# Patient Record
Sex: Female | Born: 1985 | Race: Black or African American | Hispanic: No | Marital: Single | Smoking: Never smoker
Health system: Southern US, Community
[De-identification: ages and names within clinical notes are randomized; demographics above are authoritative.]

## PROBLEM LIST (undated history)

## (undated) DIAGNOSIS — Z972 Presence of dental prosthetic device (complete) (partial): Secondary | ICD-10-CM

## (undated) DIAGNOSIS — F32A Depression, unspecified: Secondary | ICD-10-CM

## (undated) DIAGNOSIS — R0609 Other forms of dyspnea: Secondary | ICD-10-CM

## (undated) DIAGNOSIS — K219 Gastro-esophageal reflux disease without esophagitis: Secondary | ICD-10-CM

## (undated) DIAGNOSIS — F329 Major depressive disorder, single episode, unspecified: Secondary | ICD-10-CM

## (undated) DIAGNOSIS — F419 Anxiety disorder, unspecified: Secondary | ICD-10-CM

## (undated) HISTORY — PX: NO PAST SURGERIES: SHX2092

## (undated) HISTORY — DX: Anxiety disorder, unspecified: F41.9

## (undated) HISTORY — DX: Major depressive disorder, single episode, unspecified: F32.9

## (undated) HISTORY — DX: Other forms of dyspnea: R06.09

## (undated) HISTORY — DX: Depression, unspecified: F32.A

---

## 2010-08-21 ENCOUNTER — Emergency Department (HOSPITAL_BASED_OUTPATIENT_CLINIC_OR_DEPARTMENT_OTHER)
Admission: EM | Admit: 2010-08-21 | Discharge: 2010-08-21 | Disposition: A | Discharge status: Home / Self Care | Payer: Self-pay | Attending: Emergency Medicine | Admitting: Emergency Medicine

## 2010-08-21 DIAGNOSIS — R51 Headache: Secondary | ICD-10-CM | POA: Insufficient documentation

## 2010-08-21 DIAGNOSIS — K047 Periapical abscess without sinus: Secondary | ICD-10-CM | POA: Insufficient documentation

## 2013-06-23 ENCOUNTER — Ambulatory Visit: Payer: BC Managed Care – PPO

## 2016-02-05 ENCOUNTER — Encounter (INDEPENDENT_AMBULATORY_CARE_PROVIDER_SITE_OTHER): Payer: Self-pay

## 2016-02-05 ENCOUNTER — Ambulatory Visit (INDEPENDENT_AMBULATORY_CARE_PROVIDER_SITE_OTHER): Payer: 59 | Admitting: Psychiatry

## 2016-02-05 ENCOUNTER — Encounter (HOSPITAL_COMMUNITY): Payer: Self-pay | Admitting: Psychiatry

## 2016-02-05 VITALS — BP 118/72 | HR 87 | Ht 60.0 in | Wt 168.2 lb

## 2016-02-05 DIAGNOSIS — F332 Major depressive disorder, recurrent severe without psychotic features: Secondary | ICD-10-CM | POA: Diagnosis not present

## 2016-02-05 DIAGNOSIS — F411 Generalized anxiety disorder: Secondary | ICD-10-CM

## 2016-02-05 DIAGNOSIS — F4310 Post-traumatic stress disorder, unspecified: Secondary | ICD-10-CM

## 2016-02-05 MED ORDER — VENLAFAXINE HCL ER 75 MG PO CP24: 225.0000 mg | ORAL_CAPSULE | Freq: Every day | ORAL | 3 refills | Status: DC
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
## 2016-04-26 ENCOUNTER — Ambulatory Visit: Payer: Self-pay

## 2016-04-27 ENCOUNTER — Ambulatory Visit (INDEPENDENT_AMBULATORY_CARE_PROVIDER_SITE_OTHER): Payer: 59 | Admitting: Physician Assistant

## 2016-04-27 ENCOUNTER — Ambulatory Visit (INDEPENDENT_AMBULATORY_CARE_PROVIDER_SITE_OTHER): Payer: 59

## 2016-04-27 VITALS — BP 120/80 | HR 88 | Temp 97.8°F | Resp 16 | Ht 64.0 in | Wt 187.0 lb

## 2016-04-27 DIAGNOSIS — K5900 Constipation, unspecified: Secondary | ICD-10-CM | POA: Diagnosis not present

## 2016-04-27 DIAGNOSIS — M542 Cervicalgia: Secondary | ICD-10-CM | POA: Diagnosis not present

## 2016-04-27 DIAGNOSIS — F4310 Post-traumatic stress disorder, unspecified: Secondary | ICD-10-CM

## 2016-04-27 MED ORDER — LINACLOTIDE 145 MCG PO CAPS: 145.0000 ug | ORAL_CAPSULE | Freq: Every day | ORAL | 2 refills | Status: DC

## 2016-04-27 MED ORDER — VENLAFAXINE HCL 75 MG PO TABS: 75.0000 mg | ORAL_TABLET | Freq: Three times a day (TID) | ORAL | 0 refills | Status: DC

## 2016-04-27 MED ORDER — MELOXICAM 7.5 MG PO TABS: 7.5000 mg | ORAL_TABLET | Freq: Every day | ORAL | 2 refills | Status: DC
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
## 2016-04-27 NOTE — Patient Instructions (Signed)
Your x-rays are negative.  Meloxicam is an anti-inflammatory. Take one a day - if this is not helping, you can take up to two pills a day. Play around with the dose: one in the morning and one at night/two at night/two in the morning -- it's up to you. Also use heat and/or ice on your neck - 15 min at a time, three times a day. Stretching and massage will help as well.    Integrative therapy will call you to set up an appointment. Please call and leave me a message or come back if you have questions or feel like you are not improving.   Constipation: please be sure to drink several liters of water a day. You can take a fiber supplement or eat foods high in fiber to help move your bowels.  Please be sure to walk/exercise every day. This will help with your neck pain as well as relieve your constipation.   Thank you for coming in today. I hope you feel we met your needs.  Feel free to call [facility] if you have any questions or further requests.  Please consider signing up for MyChart if you do not already have it, as this is a great way to communicate with me.  Best,  Whitney Alysen Smylie, PA-C   Constipation, Adult Constipation is when a person has fewer bowel movements in a week than normal, has difficulty having a bowel movement, or has stools that are dry, hard, or larger than normal. Constipation may be caused by an underlying condition. It may become worse with age if a person takes certain medicines and does not take in enough fluids. Follow these instructions at home: Eating and drinking  Eat foods that have a lot of fiber, such as fresh fruits and vegetables, whole grains, and beans.  Limit foods that are high in fat, low in fiber, or overly processed, such as french fries, hamburgers, cookies, candies, and soda.  Drink enough fluid to keep your urine clear or pale yellow. General instructions  Exercise regularly or as told by your health care provider.  Go to the restroom when you have the  urge to go. Do not hold it in.  Take over-the-counter and prescription medicines only as told by your health care provider. These include any fiber supplements.  Practice pelvic floor retraining exercises, such as deep breathing while relaxing the lower abdomen and pelvic floor relaxation during bowel movements.  Watch your condition for any changes.  Keep all follow-up visits as told by your health care provider. This is important.   Cervical Strain and Sprain Rehab Ask your health care provider which exercises are safe for you. Do exercises exactly as told by your health care provider and adjust them as directed. It is normal to feel mild stretching, pulling, tightness, or discomfort as you do these exercises, but you should stop right away if you feel sudden pain or your pain gets worse.Do not begin these exercises until told by your health care provider. Stretching and range of motion exercises These exercises warm up your muscles and joints and improve the movement and flexibility of your neck. These exercises also help to relieve pain, numbness, and tingling. Exercise A: Cervical side bend 1. Using good posture, sit on a stable chair or stand up. 2. Without moving your shoulders, slowly tilt your left / right ear to your shoulder until you feel a stretch in your neck muscles. You should be looking straight ahead. 3. Hold for __________  seconds. 4. Repeat with the other side of your neck. Repeat __________ times. Complete this exercise __________ times a day. Exercise B: Cervical rotation 1. Using good posture, sit on a stable chair or stand up. 2. Slowly turn your head to the side as if you are looking over your left / right shoulder.  Keep your eyes level with the ground.  Stop when you feel a stretch along the side and the back of your neck. 3. Hold for __________ seconds. 4. Repeat this by turning to your other side. Repeat __________ times. Complete this exercise __________  times a day. Exercise C: Thoracic extension and pectoral stretch 1. Roll a towel or a small blanket so it is about 4 inches (10 cm) in diameter. 2. Lie down on your back on a firm surface. 3. Put the towel lengthwise, under your spine in the middle of your back. It should not be not under your shoulder blades. The towel should line up with your spine from your middle back to your lower back. 4. Put your hands behind your head and let your elbows fall out to your sides. 5. Hold for __________ seconds. Repeat __________ times. Complete this exercise __________ times a day. Strengthening exercises These exercises build strength and endurance in your neck. Endurance is the ability to use your muscles for a long time, even after your muscles get tired. Exercise D: Upper cervical flexion, isometric 1. Lie on your back with a thin pillow behind your head and a small rolled-up towel under your neck. 2. Gently tuck your chin toward your chest and nod your head down to look toward your feet. Do not lift your head off the pillow. 3. Hold for __________ seconds. 4. Release the tension slowly. Relax your neck muscles completely before you repeat this exercise. Repeat __________ times. Complete this exercise __________ times a day. Exercise E: Cervical extension, isometric 1. Stand about 6 inches (15 cm) away from a wall, with your back facing the wall. 2. Place a soft object, about 6-8 inches (15-20 cm) in diameter, between the back of your head and the wall. A soft object could be a small pillow, a ball, or a folded towel. 3. Gently tilt your head back and press into the soft object. Keep your jaw and forehead relaxed. 4. Hold for __________ seconds. 5. Release the tension slowly. Relax your neck muscles completely before you repeat this exercise. Repeat __________ times. Complete this exercise __________ times a day. Posture and body mechanics   Body mechanics refers to the movements and positions of  your body while you do your daily activities. Posture is part of body mechanics. Good posture and healthy body mechanics can help to relieve stress in your body's tissues and joints. Good posture means that your spine is in its natural S-curve position (your spine is neutral), your shoulders are pulled back slightly, and your head is not tipped forward. The following are general guidelines for applying improved posture and body mechanics to your everyday activities. Standing  When standing, keep your spine neutral and keep your feet about hip-width apart. Keep a slight bend in your knees. Your ears, shoulders, and hips should line up.  When you do a task in which you stand in one place for a long time, place one foot up on a stable object that is 2-4 inches (5-10 cm) high, such as a footstool. This helps keep your spine neutral. Sitting  When sitting, keep your spine neutral and your keep  feet flat on the floor. Use a footrest, if necessary, and keep your thighs parallel to the floor. Avoid rounding your shoulders, and avoid tilting your head forward.  When working at a desk or a computer, keep your desk at a height where your hands are slightly lower than your elbows. Slide your chair under your desk so you are close enough to maintain good posture.  When working at a computer, place your monitor at a height where you are looking straight ahead and you do not have to tilt your head forward or downward to look at the screen. Resting When lying down and resting, avoid positions that are most painful for you. Try to support your neck in a neutral position. You can use a contour pillow or a small rolled-up towel. Your pillow should support your neck but not push on it. This information is not intended to replace advice given to you by your health care provider. Make sure you discuss any questions you have with your health care provider. Document Released: 05/24/2005 Document Revised: 01/29/2016 Document  Reviewed: 04/30/2015 Elsevier Interactive Patient Education  2017 [employer].

## 2016-05-06 ENCOUNTER — Telehealth: Payer: Self-pay

## 2016-05-06 NOTE — Telephone Encounter (Signed)
Operator transferred call to Medical Records  Patient is requesting a note to her employer stating it is okay for her to return to work   [employer]    FAX NUMBER [phone]

## 2016-05-07 ENCOUNTER — Telehealth: Payer: Self-pay

## 2016-05-07 DIAGNOSIS — K5900 Constipation, unspecified: Secondary | ICD-10-CM

## 2016-05-07 NOTE — Telephone Encounter (Signed)
PA completed on phone and received approval through 11/05/16. Notified pt and she will have her pharm re-run Rx through.

## 2016-05-07 NOTE — Telephone Encounter (Signed)
Pt called back and reported that she has tried

## 2016-05-07 NOTE — Telephone Encounter (Signed)
[facility] LM asking for us to complete a PA for pt's Linzess Rx. Pt's ID # [id number]. Tried to complete on covermymeds but wouldn't recognize pt. List on generics on form though were: lactulose, Miralax, Senna, Milk of Mag, Sorbitol, metamucil. LMOM for pt to CB. Please ask pt is she has tried any of these meds on list. I will have to call [phone] once we have info.

## 2016-05-07 NOTE — Telephone Encounter (Signed)
Printed and faxed

## 2016-05-07 NOTE — Telephone Encounter (Signed)
OK to write note back to work.

## 2016-05-13 ENCOUNTER — Ambulatory Visit: Payer: 59 | Admitting: Physical Therapy

## 2016-05-14 ENCOUNTER — Encounter: Payer: Self-pay | Admitting: Physical Therapy

## 2016-05-14 ENCOUNTER — Ambulatory Visit: Payer: 59 | Attending: Physician Assistant | Admitting: Physical Therapy

## 2016-05-14 DIAGNOSIS — M25512 Pain in left shoulder: Secondary | ICD-10-CM | POA: Diagnosis present

## 2016-05-14 DIAGNOSIS — M25511 Pain in right shoulder: Secondary | ICD-10-CM | POA: Insufficient documentation

## 2016-05-14 DIAGNOSIS — M542 Cervicalgia: Secondary | ICD-10-CM | POA: Insufficient documentation
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
## 2016-05-17 ENCOUNTER — Telehealth: Payer: Self-pay
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
## 2016-05-19 NOTE — Telephone Encounter (Signed)
Thank you so much. I put it in the box 05/19/16

## 2016-05-20 NOTE — Telephone Encounter (Signed)
Paperwork scanned and faxed on 05/20/16

## 2016-05-25 ENCOUNTER — Ambulatory Visit: Payer: 59 | Admitting: Physical Therapy

## 2016-05-25 DIAGNOSIS — M542 Cervicalgia: Secondary | ICD-10-CM | POA: Diagnosis not present

## 2016-05-25 DIAGNOSIS — M25512 Pain in left shoulder: Secondary | ICD-10-CM

## 2016-05-25 DIAGNOSIS — M25511 Pain in right shoulder: Secondary | ICD-10-CM

## 2016-05-25 NOTE — Patient Instructions (Addendum)
Posture Tips DO: - stand tall and erect - keep chin tucked in - keep head and shoulders in alignment - check posture regularly in mirror or large window - pull head back against headrest in car seat;  Change your position often.  Sit with lumbar support. DON'T: - slouch or slump while watching TV or reading - sit, stand or lie in one position  for too long;  Sitting is especially hard on the spine so if you sit at a desk/use the computer, then stand up often!   Copyright  VHI. All rights reserved.  Posture - Standing   Good posture is important. Avoid slouching and forward head thrust. Maintain curve in low back and align ears over shoul- ders, hips over ankles.  Pull your belly button in toward your back bone Stand on even heel and belly button to spine with ribs lifted up ( like a golden thread to the sky) and chin down.   Copyright  VHI. All rights reserved.  Posture - Sitting   Sit upright, head facing forward. Try using a roll to support lower back. Keep shoulders relaxed, and avoid rounded back. Keep hips level with knees. Avoid crossing legs for long periods. Sit on sit bones not tail bone.   Copyright  VHI. All rights reserved.  Trigger Point Dry Needling  . What is Trigger Point Dry Needling (DN)? o DN is a physical therapy technique used to treat muscle pain and dysfunction. Specifically, DN helps deactivate muscle trigger points (muscle knots).  o A thin filiform needle is used to penetrate the skin and stimulate the underlying trigger point. The goal is for a local twitch response (LTR) to occur and for the trigger point to relax. No medication of any kind is injected during the procedure.   . What Does Trigger Point Dry Needling Feel Like?  o The procedure feels different for each individual patient. Some patients report that they do not actually feel the needle enter the skin and overall the process is not painful. Very mild bleeding may occur. However, many patients feel  a deep cramping in the muscle in which the needle was inserted. This is the local twitch response.   Marland Kitchen. How Will I feel after the treatment? o Soreness is normal, and the onset of soreness may not occur for a few hours. Typically this soreness does not last longer than two days.  o Bruising is uncommon, however; ice can be used to decrease any possible bruising.  o In rare cases feeling tired or nauseous after the treatment is normal. In addition, your symptoms may get worse before they get better, this period will typically not last longer than 24 hours.   . What Can I do After My Treatment? o Increase your hydration by drinking more water for the next 24 hours. o You may place ice or heat on the areas treated that have become sore, however, do not use heat on inflamed or bruised areas. Heat often brings more relief post needling. o You can continue your regular activities, but vigorous activity is not recommended initially after the treatment for 24 hours. o DN is best combined with other physical therapy such as strengthening, stretching, and other therapies.   Ergonomics at sitting at computer handout  Garen LahLawrie Beardsley, PT Exercise Expert for the Aging Adult  05/25/16 8:58 AM Phone: [phone] Fax: [phone]

## 2016-05-25 NOTE — Therapy (Signed)
[facility] [address] [place], [place], [address] Phone: [phone]   Fax:  [phone]  Physical Therapy Treatment  Patient Details  Name: Dana FloodStephanie Donegan MRN: [id number] Date of Birth: 06/19/1985 Referring Provider: Sebastian AcheElizabeth Whitney McVey, PA-C  Encounter Date: 05/25/2016      PT End of Session - 05/25/16 0903    Visit Number 2   Number of Visits 13   Date for PT Re-Evaluation 06/25/16   Authorization Type UHC   PT Start Time 0847   PT Stop Time 0945   PT Time Calculation (min) 58 min   Activity Tolerance Patient tolerated treatment well   Behavior During Therapy [facility] for tasks assessed/performed      Past Medical History:  Diagnosis Date  . Anxiety   . Depression     Past Surgical History:  Procedure Laterality Date  . NO PAST SURGERIES      There were no vitals filed for this visit.      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 Yes   Pain Score 4    Pain Location Neck   Pain Orientation Right;Left   Pain Descriptors / Indicators Sore;Tightness   Pain Type Acute pain   Pain Onset 1 to 4 weeks ago   Pain Frequency Intermittent                         [facility] Adult PT Treatment/Exercise - 05/25/16 0857      Self-Care   Self-Care Posture;Other Self-Care Comments   Posture sitting and standing proper for decrease of neck tension   Other Self-Care Comments  ergonomics with computer station and handout given     Modalities   Modalities Moist Heat     Moist Heat Therapy   Number Minutes Moist Heat 15 Minutes   Moist Heat Location Cervical     Manual Therapy   Manual Therapy Myofascial release;Soft tissue mobilization;Manual Traction   Manual therapy comments Pt with looser neck musculature post needling.  lateral PA right on C-3 to c-6 marked response at c -6 right   Soft tissue mobilization IASTYM upper trap and levator bil   Myofascial Release trigger point release bilateral upper trap, levator   Manual Traction suboccipital release with cervical traction     Neck Exercises: Stretches   Upper Trapezius Stretch 2 reps;30 seconds   Upper Trapezius Stretch Limitations bil   Levator Stretch 2 reps;30 seconds   Levator Stretch Limitations bil          Trigger Point Dry Needling - 05/25/16 0905    Consent Given? Yes   Education Handout Provided Yes  C-3 to C-5 right erector spinae twitch   Muscles Treated Upper Body Upper trapezius;Levator scapulae  Right and left  R> L   Upper Trapezius Response Twitch reponse elicited;Palpable increased muscle length   Levator Scapulae Response Twitch response elicited;Palpable increased muscle length              PT Education - 05/25/16 0902    Education provided Yes   Education Details educated on trigger point dry needling and aftercare and precautions and posture, sitting and standing and ergonomics at Alber[employer]computer   Person(s) Educated Patient   Methods Explanation;Demonstration;Handout   Comprehension Verbalized understanding;Returned  demonstration          PT Short Term Goals - 05/25/16 0932      PT SHORT TERM GOAL #1   Title Pt will verbalize ability to independently perform HEP as it has been established by 12/29   Baseline began establishing at eval, will progress as tol   Time 3   Period Weeks   Status On-going     PT SHORT TERM GOAL #2   Title Pt will verbalize complaince and consistency with nutrition and water intake to improve care from multiple aspects   Baseline discussed at eval   Time 3   Period Weeks   Status On-going           PT Long Term Goals - 05/14/16 1237      PT LONG TERM GOAL #1   Title FOTO to 76% ability to indicate significant functional improvement by 1/19   Baseline 64% ability at eval    Time 6   Period Weeks   Status New     PT LONG TERM GOAL #2   Title Pt will verbalize average pain levels <=3/10 with daily activities   Baseline severe, constant pain at eval   Time 6   Period Weeks   Status New     PT LONG TERM GOAL #3   Title Pt will be able to reach overhead and lift objects without pain in shoulders to improve ability in ADLs   Baseline pain at GHJ end range and with all lifting at eval   Time 6   Period Weeks   Status New     PT LONG TERM GOAL #4   Title Able to turn head and look down feeling stretch, rather than pain   Baseline pain at eval   Time 6   Period Weeks   Status New               Plan - 05/25/16 0929    Clinical Impression Statement Pt presents with gaurded and tight neck musculature and decreased arm swing protecting neck while walking.  Pt consented to trigger point dry needling and was closely monitored throughout session.  Pt responded favorably to trigger point dry needling and is aware of precautians and aftercare with stretches and heat applied at home for comfort and increased flexibility of cervical muscles   Rehab Potential Good   PT Frequency 2x / week   PT Duration 6 weeks   PT Treatment/Interventions ADLs/Self Care Home Management;Cryotherapy;Electrical Stimulation;Iontophoresis 4mg /ml Dexamethasone;Functional mobility training;Ultrasound;Traction;Moist Heat;Therapeutic activities;Therapeutic exercise;Neuromuscular re-education;Patient/family education;Passive range of motion;Manual techniques;Dry needling;Taping   PT Next Visit Plan manual techniques PRN, periscapular stability, pecoralis stretch assess benefit of dry needling   PT Home Exercise Plan upper trap and levator stretch, fluid intake trigger point dry needling aftercare   Consulted and Agree with Plan of Care Patient      Patient will benefit from skilled therapeutic intervention in order to improve the following deficits and impairments:  Impaired UE  functional use, Increased muscle spasms, Decreased activity tolerance, Pain, Improper body mechanics, Decreased strength, Postural dysfunction  Visit Diagnosis: Cervicalgia  Acute pain of right shoulder  Acute pain of left shoulder     Problem List There are no active problems to display for this patient.   Garen Lah, PT Exercise Expert for the Aging Adult  05/25/16 9:33 AM Phone: [phone] Fax: [phone]  [facility] [address] [place], [place], [address] Phone: [phone]   Fax:  [phone]  Name: Dana FloodStephanie Todd MRN: [id number] Date of Birth: 11/17/1985

## 2016-05-28 ENCOUNTER — Encounter: Payer: Self-pay | Admitting: Physical Therapy

## 2016-05-28 ENCOUNTER — Ambulatory Visit: Payer: 59 | Admitting: Physical Therapy

## 2016-05-28 DIAGNOSIS — M542 Cervicalgia: Secondary | ICD-10-CM

## 2016-05-28 DIAGNOSIS — M25512 Pain in left shoulder: Secondary | ICD-10-CM

## 2016-05-28 DIAGNOSIS — M25511 Pain in right shoulder: Secondary | ICD-10-CM

## 2016-05-28 NOTE — Therapy (Signed)
[facility] [address] [place], [place], [address] Phone: [phone]   Fax:  [phone]  Physical Therapy Treatment  Patient Details  Name: Dana FloodStephanie Todd MRN: [id number] Date of Birth: 05/05/1986 Referring Provider: Sebastian AcheElizabeth Whitney McVey, PA-C  Encounter Date: 05/28/2016      PT End of Session - 05/28/16 1034    Visit Number 3   Number of Visits 13   Date for PT Re-Evaluation 06/25/16   PT Start Time 1033  pt arrived late   PT Stop Time 1128   PT Time Calculation (min) 55 min   Activity Tolerance Patient tolerated treatment well   Behavior During Therapy [facility] for tasks assessed/performed      Past Medical History:  Diagnosis Date  . Anxiety   . Depression     Past Surgical History:  Procedure Laterality Date  . NO PAST SURGERIES      There were no vitals filed for this visit.      Subjective Assessment - 05/28/16 1034    Subjective Feeling so much better. Was sore after dry needling but feels better. Had a HA on Tuesday   Currently in Pain? Yes   Pain Score 2    Pain Location Neck   Pain Orientation Right   Pain Descriptors / Indicators Sore   Aggravating Factors  turning head   Pain Relieving Factors heat, stretches                         [facility] Adult PT Treatment/Exercise - 05/28/16 0001      Exercises   Exercises Other Exercises   Other Exercises  pelvic tilt/abdominal engagement     Shoulder Exercises: Supine   Other Supine Exercises diagonals red tband     Shoulder Exercises: Prone   Extension 10 reps  3s holds, paired with retraction   Other Prone Exercises prone on elbows serratus press     Shoulder Exercises: Standing   Other Standing Exercises bent over rows 3#   Other Standing Exercises triceps kicks 3#     Shoulder Exercises: Pulleys   Flexion 2 minutes     Shoulder Exercises: ROM/Strengthening   UBE (Upper Arm Bike) retro 4' L1   Cybex Row Limitations  cues for form/posture   Other ROM/Strengthening Exercises lat pull machine     Shoulder Exercises: Stretch   Other Shoulder Stretches open books x5 each     Moist Heat Therapy   Number Minutes Moist Heat 10 Minutes   Moist Heat Location Cervical     Manual Therapy   Manual therapy comments educated on use of theracane                PT Education - 05/28/16 1038    Education provided Yes   Education Details exercise form/rationale, HEP, importance of balacing DN with exercises   Person(s) Educated Patient   Methods Explanation;Demonstration;Tactile cues;Verbal cues   Comprehension Verbalized understanding;Returned demonstration;Verbal cues required;Tactile cues required;Need further instruction          PT Short Term Goals - 05/25/16 0932      PT SHORT TERM GOAL #1   Title Pt will verbalize ability to independently perform HEP as it has been established by 12/29   Baseline began establishing at eval, will progress as tol   Time 3   Period Weeks   Status On-going     PT SHORT TERM GOAL #2   Title Pt will verbalize complaince  and consistency with nutrition and water intake to improve care from multiple aspects   Baseline discussed at eval   Time 3   Period Weeks   Status On-going           PT Long Term Goals - 05/14/16 1237      PT LONG TERM GOAL #1   Title FOTO to 76% ability to indicate significant functional improvement by 1/19   Baseline 64% ability at eval   Time 6   Period Weeks   Status New     PT LONG TERM GOAL #2   Title Pt will verbalize average pain levels <=3/10 with daily activities   Baseline severe, constant pain at eval   Time 6   Period Weeks   Status New     PT LONG TERM GOAL #3   Title Pt will be able to reach overhead and lift objects without pain in shoulders to improve ability in ADLs   Baseline pain at GHJ end range and with all lifting at eval   Time 6   Period Weeks   Status New     PT LONG TERM GOAL #4   Title Able  to turn head and look down feeling stretch, rather than pain   Baseline pain at eval   Time 6   Period Weeks   Status New               Plan - 05/28/16 1126    Clinical Impression Statement Pt tolerated exercises well, shaking and fatigue noted in triceps and periscapular musculature. Educated pt in importance of balancing exercises with DN to avoid return of excessive tightness around neck. 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    PT Next Visit Plan periscap/postural strength, DN when available   PT Home Exercise Plan upper trap and levator stretch, fluid intake trigger point dry needling aftercare; prone on elbows retraction/protraction, ER yellowtband, bent over rows, triceps kicks   Consulted and Agree with Plan of Care Patient      Patient will benefit from skilled therapeutic intervention in order to improve the following deficits and impairments:     Visit Diagnosis: Cervicalgia  Acute pain of right shoulder  Acute pain of left shoulder     Problem List There are no active problems to display for this patient.   Takirah Binford C. Zakkary Thibault PT, DPT 05/28/16 11:32 AM   [facility] [address] [place], [place], [address] Phone: [phone]   Fax:  [phone]  Name: Dana FloodStephanie Todd MRN: [id number] Date of Birth: 10/20/1985

## 2016-06-08 ENCOUNTER — Encounter: Payer: Self-pay | Admitting: Physical Therapy

## 2016-06-08 ENCOUNTER — Ambulatory Visit: Payer: 59 | Attending: Physician Assistant | Admitting: Physical Therapy

## 2016-06-08 DIAGNOSIS — M25511 Pain in right shoulder: Secondary | ICD-10-CM | POA: Insufficient documentation

## 2016-06-08 DIAGNOSIS — M542 Cervicalgia: Secondary | ICD-10-CM | POA: Diagnosis present

## 2016-06-08 DIAGNOSIS — M25512 Pain in left shoulder: Secondary | ICD-10-CM | POA: Diagnosis present

## 2016-06-08 NOTE — Therapy (Addendum)
[place], [place], [address] Phone: [phone]   Fax:  [phone]  Physical Therapy Treatment/Discharge Summary  Patient Details  Name: Dana Todd MRN: [id number] Date of Birth: 12/02/1985 Referring Provider: Dorise Hiss, PA-C  Encounter Date: 06/08/2016      PT End of Session - 06/08/16 0816    Visit Number 4   Number of Visits 13   Date for PT Re-Evaluation 06/25/16   Authorization Type [employer]   PT Start Time [phone]  pt arrived late   PT Stop Time 0855   PT Time Calculation (min) 39 min   Activity Tolerance Patient tolerated treatment well   Behavior During Therapy [name] for tasks assessed/performed      Past Medical History:  Diagnosis Date  . Anxiety   . Depression     Past Surgical History:  Procedure Laterality Date  . NO PAST SURGERIES      There were no vitals filed for this visit.      Subjective Assessment - 06/08/16 0817    Subjective Pt reports she is feeling "much better" and denies any pain today. Has not done much over the last week due to the holdiays.    Currently in Pain? No/denies                         [facility] Adult PT Treatment/Exercise - 06/08/16 0001      Shoulder Exercises: Prone   Flexion 10 reps   Flexion Weight (lbs) 1   Flexion Limitations 3s holds   Extension 15 reps  1lb 3s holds, paired with scap retraction   External Rotation 10 reps   External Rotation Weight (lbs) 1   External Rotation Limitations 3s holds   Horizontal ABduction 1 10 reps   Horizontal ABduction 1 Weight (lbs) 1   Horizontal ABduction 1 Limitations 3s holds, thumbs up     Shoulder Exercises: ROM/Strengthening   UBE (Upper Arm Bike) retro 4 min L1.5     Moist Heat Therapy   Number Minutes Moist Heat 10 Minutes   Moist Heat Location Cervical  cervical thoracic in prone     Manual Therapy   Manual Therapy Joint mobilization   Joint Mobilization  prone thoracic PA and gross rib ER                PT Education - 06/08/16 0819    Education provided Yes   Education Details exercise form/rationale   Person(s) Educated Patient   Methods Explanation;Demonstration;Tactile cues;Verbal cues   Comprehension Verbalized understanding;Returned demonstration;Verbal cues required;Tactile cues required;Need further instruction          PT Short Term Goals - 06/08/16 0821      PT SHORT TERM GOAL #1   Title Pt will verbalize ability to independently perform HEP as it has been established by 12/29   Status Achieved     PT SHORT TERM GOAL #2   Title Pt will verbalize complaince and consistency with nutrition and water intake to improve care from multiple aspects   Status Achieved           PT Long Term Goals - 05/14/16 1237      PT LONG TERM GOAL #1   Title FOTO to 76% ability to indicate significant functional improvement by 1/19   Baseline 64% ability at eval   Time 6   Period Weeks   Status New     PT LONG  TERM GOAL #2   Title Pt will verbalize average pain levels <=3/10 with daily activities   Baseline severe, constant pain at eval   Time 6   Period Weeks   Status New     PT LONG TERM GOAL #3   Title Pt will be able to reach overhead and lift objects without pain in shoulders to improve ability in ADLs   Baseline pain at GHJ end range and with all lifting at eval   Time 6   Period Weeks   Status New     PT LONG TERM GOAL #4   Title Able to turn head and look down feeling stretch, rather than pain   Baseline pain at eval   Time 6   Period Weeks   Status New               Plan - 06/08/16 0825    Clinical Impression Statement Difficulty with prone challenges to upper extremities and periscapular region. Improved ability to demonstrate scapular retraction without overuse of cervical musculature. Stiffness and concordant discomfort noted with prone manual mobilizations.    PT Next Visit Plan  periscap/postural strength, DN when available/prn, lifting technique   PT Home Exercise Plan upper trap and levator stretch, fluid intake trigger point dry needling aftercare; prone on elbows retraction/protraction, ER yellowtband, bent over rows, triceps kicks   Consulted and Agree with Plan of Care Patient      Patient will benefit from skilled therapeutic intervention in order to improve the following deficits and impairments:     Visit Diagnosis: Cervicalgia  Acute pain of right shoulder  Acute pain of left shoulder     Problem List There are no active problems to display for this patient.   Dana Todd PT, DPT 06/08/16 8:46 AM   Dana Todd [address] [place], [place], [address] Phone: [phone]   Fax:  [phone]     PHYSICAL THERAPY DISCHARGE SUMMARY  Visits from Start of Care: 4  Current functional level related to goals / functional outcomes: See above   Remaining deficits: See above   Education / Equipment: Anatomy of condition, POC, HEP, exercise form/rationale  Plan: Patient agrees to discharge.  Patient goals were not met. Patient is being discharged due to not returning since the last visit.  ?????    Dana Todd PT, DPT 07/08/16 2:50 PM    Name: Dana Todd MRN: [id number] Date of Birth: 1986-03-04

## 2016-06-10 ENCOUNTER — Ambulatory Visit (HOSPITAL_COMMUNITY): Payer: Self-pay | Admitting: Psychiatry

## 2016-06-10 ENCOUNTER — Ambulatory Visit: Payer: 59 | Admitting: Physical Therapy

## 2016-06-15 ENCOUNTER — Encounter: Payer: Self-pay | Admitting: Physical Therapy

## 2016-06-15 ENCOUNTER — Ambulatory Visit: Payer: 59 | Admitting: Physical Therapy

## 2016-06-17 ENCOUNTER — Encounter: Payer: Self-pay | Admitting: Physical Therapy

## 2016-06-17 ENCOUNTER — Telehealth: Payer: Self-pay | Admitting: Physical Therapy

## 2016-06-17 ENCOUNTER — Ambulatory Visit: Payer: 59 | Admitting: Physical Therapy

## 2016-06-17 NOTE — Telephone Encounter (Signed)
Left message to let patient know that her appointments are being cancelled at this time due to multiple no show visits. Asked her to call to schedule one appointment at a time.   Dana Todd C. [name] PT, DPT 06/17/16 8:29 AM

## 2016-06-22 ENCOUNTER — Ambulatory Visit: Payer: 59 | Admitting: Physical Therapy

## 2016-06-24 ENCOUNTER — Ambulatory Visit: Payer: 59 | Admitting: Physical Therapy

## 2016-10-04 ENCOUNTER — Encounter: Payer: Self-pay | Admitting: Physician Assistant

## 2016-10-04 ENCOUNTER — Ambulatory Visit (INDEPENDENT_AMBULATORY_CARE_PROVIDER_SITE_OTHER): Payer: 59 | Admitting: Physician Assistant

## 2016-10-04 VITALS — BP 127/80 | HR 81 | Temp 98.7°F | Resp 17 | Ht 64.0 in | Wt 199.0 lb

## 2016-10-04 DIAGNOSIS — R21 Rash and other nonspecific skin eruption: Secondary | ICD-10-CM | POA: Diagnosis not present

## 2016-10-04 DIAGNOSIS — B354 Tinea corporis: Secondary | ICD-10-CM | POA: Diagnosis not present

## 2016-10-04 LAB — POCT SKIN KOH: Skin KOH, POC: POSITIVE — AB

## 2016-10-04 MED ORDER — BUTENAFINE HCL 1 % EX CREA: 1.0000 "application " | TOPICAL_CREAM | Freq: Two times a day (BID) | CUTANEOUS | 0 refills | Status: DC

## 2016-10-04 MED ORDER — HYDROXYZINE HCL 25 MG PO TABS: 12.5000 mg | ORAL_TABLET | Freq: Three times a day (TID) | ORAL | 0 refills | Status: DC | PRN

## 2016-10-04 MED ORDER — TERBINAFINE HCL 250 MG PO TABS: 250.0000 mg | ORAL_TABLET | Freq: Every day | ORAL | 0 refills | Status: DC

## 2016-10-04 NOTE — Progress Notes (Signed)
PRIMARY CARE AT [facility] [address], [place] [address] [phone]  Date:  10/04/2016   Name:  Dana Todd   DOB:  [birth year]   MRN:  [id number]  PCP:  No PCP Per Patient    History of Present Illness:  Dana Todd is a 31 y.o. female patient who presents to PCP with  Chief Complaint  Patient presents with  . Rash    started on arms and now coming up every where, itching onset 1 week     Patient has rash along arms and stomach that are pruritic for about 1 week to 10 days.  She is concerned, that she has rash They have spread to her upper chest, neck and back.  No drainage of any bumps. She does frequent gym.   She has been putting sheab butter on it for relief.   She has no children.   3 times per week the gym. She works from home.   As a child, she has a hx of eczema which resolved.   She is on a strict diet, eating asparagus, protein shake.  vegetarian  She is hydrating well, >64oz per day.   She has 1 dog, who is indoor only No other complaint from other family members No recent UR symptoms of sore throat, cough, fever, congestion...  There are no active problems to display for this patient.   Past Medical History:  Diagnosis Date  . Anxiety   . Depression     Past Surgical History:  Procedure Laterality Date  . NO PAST SURGERIES      Social History  Substance Use Topics  . Smoking status: Never Smoker  . Smokeless tobacco: Never Used  . Alcohol use 0.6 oz/week    1 Glasses of wine per week     Comment: one drink rarely on weekends    Family History  Problem Relation Age of Onset  . Bipolar disorder Mother   . Schizophrenia Mother   . Anxiety disorder Mother   . Anxiety disorder Brother   . Depression Brother   . Schizophrenia Maternal Grandfather   . Suicidality Neg Hx     Allergies  Allergen Reactions  . Pork-Derived Products     Medication list has been reviewed and updated.  Current Outpatient Prescriptions on File Prior  to Visit  Medication Sig Dispense Refill  . IBUPROFEN PO Take by mouth.    . linaclotide (LINZESS) 145 MCG CAPS capsule Take 1 capsule (145 mcg total) by mouth daily before breakfast. (Patient not taking: Reported on 10/04/2016) 30 capsule 2  . venlafaxine (EFFEXOR) 75 MG tablet Take 1 tablet (75 mg total) by mouth 3 (three) times daily with meals. (Patient not taking: Reported on 10/04/2016) 90 tablet 0   No current facility-administered medications on file prior to visit.     ROS ROS otherwise unremarkable unless listed above.  Physical Examination: BP 127/80 (BP Location: Right Arm, Patient Position: Sitting, Cuff Size: Normal)   Pulse 81   Temp 98.7 F (37.1 C) (Oral)   Resp 17   Ht  (1.626 m)   Wt 199 lb (90.3 kg)   LMP 09/27/2016   SpO2 100%   BMI 34.16 kg/m  Ideal Body Weight: Weight in (lb) to have BMI = 25: 145.3  Physical Exam  Constitutional: She is oriented to person, place, and time. She appears well-developed and well-nourished. No distress.  HENT:  Head: Normocephalic and atraumatic.  Right Ear: External ear normal.  Left  Ear: External ear normal.  Eyes: Conjunctivae and EOM are normal. Pupils are equal, round, and reactive to light.  Cardiovascular: Normal rate.   Pulmonary/Chest: Effort normal. No respiratory distress.  Neurological: She is alert and oriented to person, place, and time.  Skin: She is not diaphoretic.  Hyperpigmented annular lesion with circumferential raised scaling.    Psychiatric: She has a normal mood and affect. Her behavior is normal.    Results for orders placed or performed in visit on 10/04/16  POCT Skin KOH  Result Value Ref Range   Skin KOH, POC Positive (A) Negative     Assessment and Plan: Dana Todd is a 31 y.o. female who is here today for cc of pruritic rash. Consistent with ringworm.  Treating systemically as this is arising all over the body. Ringworm of body - Plan: terbinafine (LAMISIL) 250 MG  tablet  Rash and nonspecific skin eruption - Plan: POCT Skin KOH, terbinafine (LAMISIL) 250 MG tablet  Dana Platt, PA-C [facility] 4/30/20189:39 AM

## 2016-10-04 NOTE — Patient Instructions (Addendum)
Body Ringworm Body ringworm is an infection of the skin that often causes a ring-shaped rash. Body ringworm can affect any part of your skin. It can spread easily to others. Body ringworm is also called tinea corporis. What are the causes? This condition is caused by funguses called dermatophytes. The condition develops when these funguses grow out of control on the skin. You can get this condition if you touch a person or animal that has it. You can also get it if you share clothing, bedding, towels, or any other object with an infected person or pet. What increases the risk? This condition is more likely to develop in:  Athletes who often make skin-to-skin contact with other athletes, such as wrestlers.  People who share equipment and mats.  People with a weakened immune system. What are the signs or symptoms? Symptoms of this condition include:  Itchy, raised red spots and bumps.  Red scaly patches.  A ring-shaped rash. The rash may have:  A clear center.  Scales or red bumps at its center.  Redness near its borders.  Dry and scaly skin on or around it. How is this diagnosed? This condition can usually be diagnosed with a skin exam. A skin scraping may be taken from the affected area and examined under a microscope to see if the fungus is present. How is this treated? This condition may be treated with:  An antifungal cream or ointment.  An antifungal shampoo.  Antifungal medicines. These may be prescribed if your ringworm is severe, keeps coming back, or lasts a long time. Follow these instructions at home:  Take over-the-counter and prescription medicines only as told by your health care provider.  If you were given an antifungal cream or ointment:  Use it as told by your health care provider.  Wash the infected area and dry it completely before applying the cream or ointment.  If you were given an antifungal shampoo:  Use it as told by your health care  provider.  Leave the shampoo on your body for 3-5 minutes before rinsing.  While you have a rash:  Wear loose clothing to stop clothes from rubbing and irritating it.  Wash or change your bed sheets every night.  If your pet has the same infection, take your pet to see a International aid/development worker. How is this prevented?  Practice good hygiene.  Wear sandals or shoes in public places and showers.  Do not share personal items with others.  Avoid touching red patches of skin on other people.  Avoid touching pets that have bald spots.  If you touch an animal that has a bald spot, wash your hands. Contact a health care provider if:  Your rash continues to spread after 7 days of treatment.  Your rash is not gone in 4 weeks.  The area around your rash gets red, warm, tender, and swollen. This information is not intended to replace advice given to you by your health care provider. Make sure you discuss any questions you have with your health care provider. Document Released: 05/21/2000 Document Revised: 10/30/2015 Document Reviewed: 03/20/2015 Elsevier Interactive Patient Education  2017 [employer].     IF you received an x-ray today, you will receive an invoice from [facility]. Please contact [facility] at [phone] with questions or concerns regarding your invoice.   IF you received labwork today, you will receive an invoice from [facility]. Please contact [facility] at [phone] with questions or concerns regarding your invoice.   Our billing  staff will not be able to assist you with questions regarding bills from these companies.  You will be contacted with the lab results as soon as they are available. The fastest way to get your results is to activate your My Chart account. Instructions are located on the last page of this paperwork. If you have not heard from [place] regarding the results in 2 weeks, please contact this office.

## 2016-10-28 ENCOUNTER — Ambulatory Visit (INDEPENDENT_AMBULATORY_CARE_PROVIDER_SITE_OTHER): Payer: 59 | Admitting: Adult Health

## 2016-10-28 ENCOUNTER — Encounter: Payer: Self-pay | Admitting: Adult Health

## 2016-10-28 VITALS — BP 124/86 | Temp 98.1°F | Ht 64.0 in | Wt 197.8 lb

## 2016-10-28 DIAGNOSIS — F329 Major depressive disorder, single episode, unspecified: Secondary | ICD-10-CM | POA: Diagnosis not present

## 2016-10-28 DIAGNOSIS — Z7689 Persons encountering health services in other specified circumstances: Secondary | ICD-10-CM

## 2016-10-28 DIAGNOSIS — F419 Anxiety disorder, unspecified: Secondary | ICD-10-CM

## 2016-10-28 DIAGNOSIS — F32A Depression, unspecified: Secondary | ICD-10-CM

## 2016-10-28 NOTE — Progress Notes (Signed)
Patient presents to clinic today to establish care. She is a pleasant 31 year old female who  has a past medical history of Anxiety and Depression. 7   Her last physical was in June 2017   Acute Concerns: Establish Care   Chronic Issues: Anxiety/Depression - She has been followed by psychiatry in the past and was placed on Effexo but felt as though her dose was too high and she did not like the side effects. She stopped taking it but continues to have anxiety related issues. She is seeing a therapist that she really enjoys seeing and would rather do talk therapy then go on medication at this time   Health Maintenance: Dental -- Does not do routine care Vision -- Does not follow up with routine care Immunizations -- UTD  PAP -- 2016 Diet: Vegetarian  Exercise: Exercises frequently.    Followed by   GYN  Therapy    Past Medical History:  Diagnosis Date  . Anxiety   . Depression     Past Surgical History:  Procedure Laterality Date  . NO PAST SURGERIES      No current outpatient prescriptions on file prior to visit.   No current facility-administered medications on file prior to visit.     Allergies  Allergen Reactions  . Pork-Derived Products     Family History  Problem Relation Age of Onset  . Bipolar disorder Mother   . Schizophrenia Mother   . Anxiety disorder Mother   . Anxiety disorder Brother   . Depression Brother   . Schizophrenia Maternal Grandfather   . Suicidality Neg Hx     Social History   Social History  . Marital status: Single    Spouse name: N/A  . Number of children: 0  . 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 Smoking status: Never Smoker  . Smokeless tobacco: Never Used  . Alcohol use 0.6 oz/week    1 Glasses of wine per week     Comment: one drink rarely on weekends  . Drug use: No  . Sexual activity: Yes    Birth control/ protection:  None   Other Topics Concern  . Not on file   Social History Narrative   Lives in [place], alone, has one dog. Never been married, no kids. 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 Grew up in [place] and was raised by mom. Has 2 brother and pt is middle child. 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     Review of Systems  Constitutional: Negative.   HENT: Negative.   Eyes: Negative.   Genitourinary: Negative.   Skin: Negative.   Neurological: Negative.   Psychiatric/Behavioral: Negative.   All other systems reviewed and are negative.      BP 124/86 (BP Location: Left Arm, Patient Position: Sitting, Cuff Size: Normal)   Temp 98.1 F (36.7 C) (Oral)   Ht 5\' 4"  (1.626 m)   Wt 197 lb 12.8 oz (89.7 kg)   BMI 33.95 kg/m   Physical Exam  Constitutional: She is oriented to person, place, and time and well-developed, well-nourished, and in no distress. No distress.  Cardiovascular: Normal rate, regular rhythm, normal heart sounds and intact distal pulses.  Exam reveals no gallop and no friction rub.   No murmur heard. Musculoskeletal: Normal range  of motion. She exhibits no edema, tenderness or deformity.  Neurological: She is alert and oriented to person, place, and time. Gait normal. GCS score is 15.  Skin: Skin is warm and dry. No rash noted. She is not diaphoretic. No erythema. No pallor.  Psychiatric: Mood, memory, affect and judgment normal.  Nursing note and vitals reviewed.  Assessment/Plan:  1. Encounter to establish care - Follow up for CPE  - Follow up sooner if needed   2. Anxiety and depression -Controlled without medication at this time  - Can consider Celexa in the future if needed  Shirline Freesory Audreena Sachdeva, NP

## 2017-01-16 DIAGNOSIS — L0501 Pilonidal cyst with abscess: Secondary | ICD-10-CM | POA: Diagnosis not present

## 2017-01-19 DIAGNOSIS — L0501 Pilonidal cyst with abscess: Secondary | ICD-10-CM | POA: Diagnosis not present

## 2017-01-24 DIAGNOSIS — L0501 Pilonidal cyst with abscess: Secondary | ICD-10-CM | POA: Diagnosis not present

## 2017-01-27 ENCOUNTER — Encounter: Payer: Self-pay | Admitting: Adult Health

## 2017-02-24 ENCOUNTER — Ambulatory Visit (INDEPENDENT_AMBULATORY_CARE_PROVIDER_SITE_OTHER): Payer: 59 | Admitting: Adult Health

## 2017-02-24 ENCOUNTER — Encounter: Payer: Self-pay | Admitting: Adult Health

## 2017-02-24 VITALS — BP 116/76 | Temp 98.1°F | Ht 61.75 in | Wt 203.0 lb

## 2017-02-24 DIAGNOSIS — Z0000 Encounter for general adult medical examination without abnormal findings: Secondary | ICD-10-CM

## 2017-02-24 DIAGNOSIS — F419 Anxiety disorder, unspecified: Secondary | ICD-10-CM | POA: Diagnosis not present

## 2017-02-24 DIAGNOSIS — F329 Major depressive disorder, single episode, unspecified: Secondary | ICD-10-CM | POA: Diagnosis not present

## 2017-02-24 DIAGNOSIS — E559 Vitamin D deficiency, unspecified: Secondary | ICD-10-CM | POA: Diagnosis not present

## 2017-02-24 DIAGNOSIS — F32A Depression, unspecified: Secondary | ICD-10-CM | POA: Insufficient documentation

## 2017-02-24 LAB — CBC WITH DIFFERENTIAL/PLATELET
BASOS ABS: 0 10*3/uL (ref 0.0–0.1)
Basophils Relative: 0.3 % (ref 0.0–3.0)
Eosinophils Absolute: 0.2 10*3/uL (ref 0.0–0.7)
Eosinophils Relative: 2.4 % (ref 0.0–5.0)
HEMATOCRIT: 38.4 % (ref 36.0–46.0)
HEMOGLOBIN: 12.6 g/dL (ref 12.0–15.0)
LYMPHS PCT: 26.3 % (ref 12.0–46.0)
Lymphs Abs: 2.6 10*3/uL (ref 0.7–4.0)
MCHC: 32.9 g/dL (ref 30.0–36.0)
MCV: 93 fl (ref 78.0–100.0)
MONOS PCT: 7.1 % (ref 3.0–12.0)
Monocytes Absolute: 0.7 10*3/uL (ref 0.1–1.0)
Neutro Abs: 6.2 10*3/uL (ref 1.4–7.7)
Neutrophils Relative %: 63.9 % (ref 43.0–77.0)
Platelets: 293 10*3/uL (ref 150.0–400.0)
RBC: 4.13 Mil/uL (ref 3.87–5.11)
RDW: 14.2 % (ref 11.5–15.5)
WBC: 9.7 10*3/uL (ref 4.0–10.5)

## 2017-02-24 LAB — LIPID PANEL
CHOL/HDL RATIO: 3
Cholesterol: 195 mg/dL (ref 0–200)
HDL: 59.3 mg/dL (ref >39.00)
LDL Cholesterol: 118 mg/dL — ABNORMAL HIGH (ref 0–99)
NONHDL: 136.16
Triglycerides: 93 mg/dL (ref 0.0–149.0)
VLDL: 18.6 mg/dL (ref 0.0–40.0)

## 2017-02-24 LAB — HEPATIC FUNCTION PANEL
ALK PHOS: 65 U/L (ref 39–117)
ALT: 10 U/L (ref 0–35)
AST: 15 U/L (ref 0–37)
Albumin: 4.1 g/dL (ref 3.5–5.2)
BILIRUBIN DIRECT: 0.1 mg/dL (ref 0.0–0.3)
TOTAL PROTEIN: 7.4 g/dL (ref 6.0–8.3)
Total Bilirubin: 0.4 mg/dL (ref 0.2–1.2)

## 2017-02-24 LAB — BASIC METABOLIC PANEL
BUN: 8 mg/dL (ref 6–23)
CALCIUM: 9.4 mg/dL (ref 8.4–10.5)
CO2: 26 meq/L (ref 19–32)
Chloride: 103 mEq/L (ref 96–112)
Creatinine, Ser: 0.79 mg/dL (ref 0.40–1.20)
GFR: 110.63 mL/min (ref >60.00)
Glucose, Bld: 90 mg/dL (ref 70–99)
Potassium: 3.8 mEq/L (ref 3.5–5.1)
SODIUM: 139 meq/L (ref 135–145)

## 2017-02-24 LAB — TSH: TSH: 3.68 u[IU]/mL (ref 0.35–4.50)

## 2017-02-24 LAB — VITAMIN D 25 HYDROXY (VIT D DEFICIENCY, FRACTURES): VITD: 19.96 ng/mL — AB (ref 30.00–100.00)

## 2017-02-24 LAB — HEMOGLOBIN A1C: Hgb A1c MFr Bld: 5.6 % (ref 4.6–6.5)

## 2017-02-24 NOTE — Addendum Note (Signed)
Addended by: Nancy Fetter on: 02/24/2017 07:26 AM   Modules accepted: Level of Service

## 2017-02-24 NOTE — Progress Notes (Addendum)
Subjective:    Patient ID: Dana Todd, female    DOB: 1985/12/19, 31 y.o.   MRN: [id number]  HPI  Patient presents for yearly preventative medicine examination. She is a pleasant 31 year old female who  has a past medical history of Anxiety and Depression.  Anxiety and Depression - Does not currently take medication. She sees a therapist which she enjoys. She feels as though her anxiety and depression has improved.   All immunizations and health maintenance protocols were reviewed with the patient and needed orders were placed. All vaccinations UTD  Appropriate screening laboratory values were ordered for the patient including screening of hyperlipidemia, renal function and hepatic function.  Medication reconciliation,  past medical history, social history, problem list and allergies were reviewed in detail with the patient  Goals were established with regard to weight loss, exercise, and  diet in compliance with medications. She is a vegetarian and exercises frequently. She ensures that she gets enough dietary protein.  She is trying to lose weight but has been working a lot of hours at work and has not been able to get to the gym. She works from home and spends most of her time on the computer.   Her last PAP was in 2016, she is seen by GYN. She does not participate in routine dental or vision care.    Review of Systems  Constitutional: Negative.   HENT: Negative.   Eyes: Negative.   Respiratory: Negative.   Cardiovascular: Negative.   Gastrointestinal: Negative.   Endocrine: Negative.   Genitourinary: Negative.   Musculoskeletal: Negative.   Skin: Negative.   Allergic/Immunologic: Negative.   Neurological: Negative.   Hematological: Negative.   Psychiatric/Behavioral: Negative.    Past Medical History:  Diagnosis Date  . Anxiety   . Depression     Social History   Social History  . Marital status: Single    Spouse name: N/A  . Number of children: 0  . Years  of education: 15   Occupational History  . 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 Smoking status: Never Smoker  . Smokeless tobacco: Never Used  . Alcohol use 0.6 oz/week    1 Glasses of wine per week     Comment: one drink rarely on weekends  . Drug use: No  . Sexual activity: Yes    Birth control/ protection: None   Other Topics Concern  . Not on file   Social History Narrative   Lives in [place], alone, has one dog. Never been married, no kids. 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 Grew up in [place] and was raised by mom. Has 2 brother and pt is middle child. 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     Past Surgical History:  Procedure Laterality Date  . NO PAST SURGERIES      Family History  Problem Relation Age of Onset  . Bipolar disorder Mother   . Schizophrenia Mother   . Anxiety disorder Mother   . Anxiety disorder Brother   . Depression Brother   . Post-traumatic stress disorder Brother   . Schizophrenia Maternal Grandfather   . Suicidality Neg Hx     Allergies  Allergen Reactions  . Pork-Derived Products     No current outpatient prescriptions on file prior to visit.   No current facility-administered medications on file prior to visit.  BP 116/76   Temp 98.1 F (36.7 C)   Ht 5' 1.75" (1.568 m)   Wt 203 lb (92.1 kg)   BMI 37.43 kg/m       Objective:   Physical Exam  Constitutional: She is oriented to person, place, and time. She appears well-developed and well-nourished. No distress.  obese  HENT:  Head: Normocephalic and atraumatic.  Right Ear: External ear normal.  Left Ear: External ear normal.  Nose: Nose normal.  Mouth/Throat: Oropharynx is clear and moist. No oropharyngeal exudate.  Eyes: Pupils are equal, round, and reactive to light. Conjunctivae and EOM are normal. Right eye exhibits no discharge. Left eye exhibits no discharge. No scleral  icterus.  Neck: Normal range of motion. Neck supple. No JVD present. No tracheal deviation present. No thyromegaly present.  Cardiovascular: Normal rate, regular rhythm, normal heart sounds and intact distal pulses.  Exam reveals no gallop and no friction rub.   No murmur heard. Pulmonary/Chest: Effort normal and breath sounds normal. No stridor. No respiratory distress. She has no wheezes. She has no rales. She exhibits no tenderness.  Abdominal: Soft. Bowel sounds are normal. She exhibits no distension and no mass. There is no tenderness. There is no rebound and no guarding.  Genitourinary:  Genitourinary Comments: Deferred, will be done by GYN   Musculoskeletal: Normal range of motion. She exhibits no edema, tenderness or deformity.  Lymphadenopathy:    She has no cervical adenopathy.  Neurological: She is alert and oriented to person, place, and time. She has normal reflexes. She displays normal reflexes. No cranial nerve deficit. She exhibits normal muscle tone. Coordination normal.  Skin: Skin is warm and dry. No rash noted. She is not diaphoretic. No erythema. No pallor.  Psychiatric: She has a normal mood and affect. Her behavior is normal. Judgment and thought content normal.  Nursing note and vitals reviewed.     Assessment & Plan:  1. Routine general medical examination at a health care facility - Need to lose weight through diet and exercise  - Follow up in one year or sooner if needed  - Basic metabolic panel - CBC with Differential/Platelet - Hepatic function panel - Lipid panel - TSH - HIV antibody - Hemoglobin A1c  2. Anxiety and depression - Well controlled. Continue with therapy   3. Vitamin D deficiency  - Vitamin D, 25-hydroxy  Shirline Frees, NP

## 2017-02-24 NOTE — Patient Instructions (Addendum)
It was great seeing you today   Please work on diet and exercise to lose weight   Follow up in one year for your next physical or sooner if needed

## 2017-02-25 LAB — HIV ANTIBODY (ROUTINE TESTING W REFLEX): HIV: NONREACTIVE

## 2017-04-07 DIAGNOSIS — Z23 Encounter for immunization: Secondary | ICD-10-CM | POA: Diagnosis not present

## 2017-09-06 ENCOUNTER — Encounter: Payer: Self-pay | Admitting: Physician Assistant

## 2017-11-11 ENCOUNTER — Encounter (HOSPITAL_COMMUNITY): Payer: Self-pay | Admitting: *Deleted

## 2017-11-11 ENCOUNTER — Emergency Department (HOSPITAL_COMMUNITY)
Admission: EM | Admit: 2017-11-11 | Discharge: 2017-11-11 | Disposition: A | Discharge status: Home / Self Care | Payer: 59 | Attending: Emergency Medicine | Admitting: Emergency Medicine

## 2017-11-11 ENCOUNTER — Emergency Department (HOSPITAL_COMMUNITY): Payer: 59

## 2017-11-11 DIAGNOSIS — R0602 Shortness of breath: Secondary | ICD-10-CM | POA: Insufficient documentation

## 2017-11-11 DIAGNOSIS — R0789 Other chest pain: Secondary | ICD-10-CM | POA: Diagnosis present

## 2017-11-11 DIAGNOSIS — R112 Nausea with vomiting, unspecified: Secondary | ICD-10-CM | POA: Diagnosis not present

## 2017-11-11 LAB — URINALYSIS, ROUTINE W REFLEX MICROSCOPIC
Bilirubin Urine: NEGATIVE
GLUCOSE, UA: NEGATIVE mg/dL
Hgb urine dipstick: NEGATIVE
KETONES UR: NEGATIVE mg/dL
LEUKOCYTES UA: NEGATIVE
Nitrite: NEGATIVE
PH: 5 (ref 5.0–8.0)
Protein, ur: NEGATIVE mg/dL
Specific Gravity, Urine: 1.019 (ref 1.005–1.030)

## 2017-11-11 LAB — CBC WITH DIFFERENTIAL/PLATELET
Abs Immature Granulocytes: 0.1 10*3/uL (ref 0.0–0.1)
Basophils Absolute: 0.1 10*3/uL (ref 0.0–0.1)
Basophils Relative: 0 %
EOS PCT: 0 %
Eosinophils Absolute: 0 10*3/uL (ref 0.0–0.7)
HEMATOCRIT: 39 % (ref 36.0–46.0)
Hemoglobin: 12.6 g/dL (ref 12.0–15.0)
IMMATURE GRANULOCYTES: 0 %
LYMPHS PCT: 10 %
Lymphs Abs: 1.7 10*3/uL (ref 0.7–4.0)
MCH: 29.8 pg (ref 26.0–34.0)
MCHC: 32.3 g/dL (ref 30.0–36.0)
MCV: 92.2 fL (ref 78.0–100.0)
Monocytes Absolute: 0.9 10*3/uL (ref 0.1–1.0)
Monocytes Relative: 5 %
NEUTROS PCT: 85 %
Neutro Abs: 14.1 10*3/uL — ABNORMAL HIGH (ref 1.7–7.7)
Platelets: 265 10*3/uL (ref 150–400)
RBC: 4.23 MIL/uL (ref 3.87–5.11)
RDW: 13.4 % (ref 11.5–15.5)
WBC: 16.8 10*3/uL — AB (ref 4.0–10.5)

## 2017-11-11 LAB — COMPREHENSIVE METABOLIC PANEL
ALK PHOS: 73 U/L (ref 38–126)
ALT: 12 U/L — AB (ref 14–54)
ANION GAP: 11 (ref 5–15)
AST: 22 U/L (ref 15–41)
Albumin: 3.6 g/dL (ref 3.5–5.0)
BUN: 8 mg/dL (ref 6–20)
CALCIUM: 9.1 mg/dL (ref 8.9–10.3)
CO2: 21 mmol/L — AB (ref 22–32)
CREATININE: 0.86 mg/dL (ref 0.44–1.00)
Chloride: 105 mmol/L (ref 101–111)
GFR calc Af Amer: >60 mL/min (ref >60)
GFR calc non Af Amer: >60 mL/min (ref >60)
Glucose, Bld: 163 mg/dL — ABNORMAL HIGH (ref 65–99)
Potassium: 4.1 mmol/L (ref 3.5–5.1)
SODIUM: 137 mmol/L (ref 135–145)
Total Bilirubin: 0.5 mg/dL (ref 0.3–1.2)
Total Protein: 7.5 g/dL (ref 6.5–8.1)

## 2017-11-11 LAB — I-STAT BETA HCG BLOOD, ED (MC, WL, AP ONLY): I-stat hCG, quantitative: <5 m[IU]/mL (ref <5)

## 2017-11-11 LAB — LIPASE, BLOOD: Lipase: 26 U/L (ref 11–51)

## 2017-11-11 MED ORDER — ONDANSETRON 8 MG PO TBDP: 8.0000 mg | ORAL_TABLET | Freq: Three times a day (TID) | ORAL | 0 refills | Status: DC | PRN

## 2017-11-11 MED ORDER — SODIUM CHLORIDE 0.9 % IV BOLUS: 1000.0000 mL | Freq: Once | INTRAVENOUS | Status: DC

## 2017-11-11 MED ORDER — ONDANSETRON 4 MG PO TBDP
8.0000 mg | ORAL_TABLET | Freq: Once | ORAL | Status: AC
  Administered 2017-11-11: 8 mg via ORAL
  Filled 2017-11-11: qty 2

## 2017-11-11 MED ORDER — GI COCKTAIL ~~LOC~~
30.0000 mL | Freq: Once | ORAL | Status: AC
  Administered 2017-11-11: 30 mL via ORAL
  Filled 2017-11-11: qty 30

## 2017-11-11 MED ORDER — SODIUM CHLORIDE 0.9 % IV BOLUS
1000.0000 mL | Freq: Once | INTRAVENOUS | Status: AC
  Administered 2017-11-11: 1000 mL via INTRAVENOUS

## 2017-11-11 NOTE — ED Provider Notes (Signed)
[facility] EMERGENCY DEPARTMENT Provider Note   CSN: [id number] Arrival date & time: 11/11/17  6213     History   Chief Complaint Chief Complaint  Patient presents with  . Emesis    HPI Dana Todd is a 32 y.o. female.  HPI Dana Todd is a 32 y.o. female presents to emergency department with complaint of chest tightness and shortness of breath, as well as nausea and vomiting.  Patient states her symptoms began early this morning after she took her vitamin.  She states she felt nauseated and vomited.  She states that at the same time her chest and her throat started feeling tight and she felt like she could not breathe.  Her fianc thought she was in distress and they called EMS.  Before EMS even arrived to their house, they decided they wanted to come to the hospital and he drove her here.  She states she is feeling better.  She states she has history of panic attacks but states that she has never felt shortness of breath or has thrown up this much with her panic attacks.  She states that she vomited large amounts of clear liquid.  Denies any abdominal pain.  States she is not pregnant.  No other complaints at this time.  Past Medical History:  Diagnosis Date  . Anxiety   . Depression     Patient Active Problem List   Diagnosis Date Noted  . Anxiety and depression 02/24/2017    Past Surgical History:  Procedure Laterality Date  . NO PAST SURGERIES       OB History   None      Home Medications    Prior to Admission medications   Not on File    Family History Family History  Problem Relation Age of Onset  . Bipolar disorder Mother   . Schizophrenia Mother   . Anxiety disorder Mother   . Anxiety disorder Brother   . Depression Brother   . Post-traumatic stress disorder Brother   . Schizophrenia Maternal Grandfather   . Suicidality Neg Hx     Social History Social History   Tobacco Use  . Smoking status: Never Smoker  .  Smokeless tobacco: Never Used  Substance Use Topics  . Alcohol use: Yes    Alcohol/week: 0.6 oz    Types: 1 Glasses of wine per week    Comment: one drink rarely on weekends  . Drug use: No     Allergies   Pork-derived products   Review of Systems Review of Systems  Constitutional: Negative for chills and fever.  Respiratory: Positive for chest tightness and shortness of breath. Negative for cough.   Cardiovascular: Positive for chest pain. Negative for palpitations and leg swelling.  Gastrointestinal: Positive for nausea and vomiting. Negative for abdominal pain and diarrhea.  Genitourinary: Negative for dysuria, flank pain, pelvic pain, vaginal bleeding, vaginal discharge and vaginal pain.  Musculoskeletal: Negative for arthralgias, myalgias, neck pain and neck stiffness.  Skin: Negative for rash.  Neurological: Positive for dizziness and light-headedness. Negative for weakness and headaches.  All other systems reviewed and are negative.    Physical Exam Updated Vital Signs BP 130/65   Pulse (!) 113   Temp 98.9 F (37.2 C)   Resp (!) 30   LMP 10/16/2017   SpO2 95%   Physical Exam  Constitutional: She appears well-developed and well-nourished. No distress.  HENT:  Head: Normocephalic.  Eyes: Conjunctivae are normal.  Neck: Neck supple.  Cardiovascular: Regular rhythm and normal heart sounds.  tachycardic  Pulmonary/Chest: Effort normal and breath sounds normal. No respiratory distress. She has no wheezes. She has no rales.  Abdominal: Soft. Bowel sounds are normal. She exhibits no distension. There is no tenderness. There is no rebound.  Musculoskeletal: She exhibits no edema.  Neurological: She is alert.  Skin: Skin is warm and dry.  Psychiatric: She has a normal mood and affect. Her behavior is normal.  Nursing note and vitals reviewed.    ED Treatments / Results  Labs (all labs ordered are listed, but only abnormal results are displayed) Labs Reviewed    COMPREHENSIVE METABOLIC PANEL - Abnormal; Notable for the following components:      Result Value   CO2 21 (*)    Glucose, Bld 163 (*)    ALT 12 (*)    All other components within normal limits  CBC WITH DIFFERENTIAL/PLATELET - Abnormal; Notable for the following components:   WBC 16.8 (*)    Neutro Abs 14.1 (*)    All other components within normal limits  LIPASE, BLOOD  URINALYSIS, ROUTINE W REFLEX MICROSCOPIC  I-STAT BETA HCG BLOOD, ED ([facility], [facility], [facility] ONLY)    EKG None  Radiology Dg Chest 2 View  Result Date: 11/11/2017 CLINICAL DATA:  Shortness of breath this morning EXAM: CHEST - 2 VIEW COMPARISON:  None. FINDINGS: Cardiomediastinal silhouette is within normal limits in size and configuration. Lungs are clear. Lung volumes are normal. No evidence of pneumonia. No pleural effusion. No pneumothorax seen. Osseous and soft tissue structures about the chest are unremarkable. IMPRESSION: No active cardiopulmonary disease. No evidence of pneumonia, aspiration or pulmonary edema. Electronically Signed   By: Bary RichardStan  Maynard M.D.   On: 11/11/2017 10:01    Procedures Procedures (including critical care time)  Medications Ordered in ED Medications  ondansetron (ZOFRAN-ODT) disintegrating tablet 8 mg (8 mg Oral Given 11/11/17 1010)  sodium chloride 0.9 % bolus 1,000 mL (0 mLs Intravenous Stopped 11/11/17 1108)  gi cocktail (Maalox,Lidocaine,Donnatal) (30 mLs Oral Given 11/11/17 1010)     Initial Impression / Assessment and Plan / ED Course  I have reviewed the triage vital signs and the nursing notes.  Pertinent labs & imaging results that were available during my care of the patient were reviewed by me and considered in my medical decision making (see chart for details).     Patient is well-appearing 32 year old, here with episode of nausea, vomiting, shortness of breath, chest tightness.  She has no abdominal pain or tenderness on exam.  She is mildly tachycardic, but states her symptoms are  improving.  No risk factors for pulmonary embolism.  Symptoms are atypical for ACS.  Most likely nausea and vomiting due to a vitamin pill that she took, possibly gastroenteritis, possibly panic attack.  Will check labs, pregnancy test, will try GI cocktail, IV fluids, Zofran.  Patient's labs are unremarkable other than elevation of white blood cell count.  She was hydrated with IV fluids, heart rate improved.  She denies any symptoms at this time.  Specifically states that she does not have any shortness of breath or nausea or vomiting.  She is tolerating oral fluids.  We will discharge her home.  Abdomen has been benign entire visit, do not think she needs any imaging.  Most likely panic attack versus nausea and vomiting from taking vitamins on empty stomach which followed by a panic attack.  Will discharge home with strict return precautions.  Vitals:   11/11/17  0900 11/11/17 1015 11/11/17 1145 11/11/17 1200  BP: 130/65   (!) 97/59  Pulse: (!) 113 (!) 103 95 99  Resp:      Temp:      SpO2: 95% 95% 97% 95%     Final Clinical Impressions(s) / ED Diagnoses   Final diagnoses:  None    ED Discharge Orders    None       Jaynie Crumble, PA-C 11/11/17 1535    Doug Sou, MD 11/11/17 1818

## 2017-11-11 NOTE — Discharge Instructions (Addendum)
Take Zofran as prescribed as needed for nausea and vomiting.  Drink plenty of fluids.  Do not take vitamins on empty stomach.  Follow-up with family doctor.

## 2017-11-11 NOTE — ED Triage Notes (Signed)
Pt in c/o vomiting that started this morning, states she has vomited multiple times and has been unable to keep anything down, states she started to have a panic attack at home and thought she was choking, pt speaking in full sentences, hyperventilating at times in triage but able to be redirected and slow breathing, states she has a history of same, alert and oriented

## 2017-11-11 NOTE — ED Notes (Signed)
Unsuccessful IV placement X 1.

## 2017-11-18 DIAGNOSIS — K5900 Constipation, unspecified: Secondary | ICD-10-CM | POA: Diagnosis not present

## 2017-11-18 DIAGNOSIS — R350 Frequency of micturition: Secondary | ICD-10-CM | POA: Diagnosis not present

## 2017-12-09 DIAGNOSIS — R350 Frequency of micturition: Secondary | ICD-10-CM | POA: Diagnosis not present

## 2017-12-09 DIAGNOSIS — K5900 Constipation, unspecified: Secondary | ICD-10-CM | POA: Diagnosis not present

## 2018-01-16 ENCOUNTER — Emergency Department (HOSPITAL_COMMUNITY)
Admission: EM | Admit: 2018-01-16 | Discharge: 2018-01-16 | Disposition: A | Discharge status: Home / Self Care | Payer: 59 | Attending: Emergency Medicine | Admitting: Emergency Medicine

## 2018-01-16 ENCOUNTER — Encounter (HOSPITAL_COMMUNITY): Payer: Self-pay | Admitting: *Deleted

## 2018-01-16 ENCOUNTER — Emergency Department (HOSPITAL_COMMUNITY): Payer: 59

## 2018-01-16 DIAGNOSIS — S51822A Laceration with foreign body of left forearm, initial encounter: Secondary | ICD-10-CM | POA: Diagnosis not present

## 2018-01-16 DIAGNOSIS — Y990 Civilian activity done for income or pay: Secondary | ICD-10-CM | POA: Diagnosis not present

## 2018-01-16 DIAGNOSIS — S51812A Laceration without foreign body of left forearm, initial encounter: Secondary | ICD-10-CM

## 2018-01-16 DIAGNOSIS — Y939 Activity, unspecified: Secondary | ICD-10-CM | POA: Insufficient documentation

## 2018-01-16 DIAGNOSIS — W25XXXA Contact with sharp glass, initial encounter: Secondary | ICD-10-CM | POA: Diagnosis not present

## 2018-01-16 DIAGNOSIS — S61512A Laceration without foreign body of left wrist, initial encounter: Secondary | ICD-10-CM | POA: Diagnosis not present

## 2018-01-16 DIAGNOSIS — Y9289 Other specified places as the place of occurrence of the external cause: Secondary | ICD-10-CM | POA: Diagnosis not present

## 2018-01-16 MED ORDER — OXYCODONE-ACETAMINOPHEN 5-325 MG PO TABS
1.0000 | ORAL_TABLET | Freq: Once | ORAL | Status: AC
  Administered 2018-01-16: 1 via ORAL
  Filled 2018-01-16: qty 1

## 2018-01-16 MED ORDER — LIDOCAINE-EPINEPHRINE (PF) 2 %-1:200000 IJ SOLN
10.0000 mL | Freq: Once | INTRAMUSCULAR | Status: AC
  Administered 2018-01-16: 10 mL
  Filled 2018-01-16: qty 20

## 2018-01-16 MED ORDER — CEPHALEXIN 500 MG PO CAPS: 500.0000 mg | ORAL_CAPSULE | Freq: Four times a day (QID) | ORAL | 0 refills | Status: AC

## 2018-01-16 NOTE — ED Provider Notes (Signed)
MSE was initiated and I personally evaluated the patient and placed orders (if any) at  5:41 PM on January 16, 2018.  The patient appears stable so that the remainder of the MSE may be completed by another provider.  Patient placed in Quick Look pathway, seen and evaluated   Chief Complaint: Laceration  HPI:   Patient is a 31-year female with no significant past medical history presenting for laceration to the left forearm on the volar surface.  Patient reports that just prior to arrival, a coffee pot "exploded" in her hands, and lacerated her left forearm.  Patient reports that there was no pulsatile bleeding at the scene, and she compressed it immediately achieving hemostasis.  No loss of sensation or weakness in her fingers of left hand.  Last tetanus in 2016.  ROS: See HPI (one)  Physical Exam:   Gen: No distress  Neuro: Awake and Alert  Skin: Warm    Focused Exam: Hemostatic dressing placed on the wound before my arrival, but patient has 2+ ulnar and radial pulse, capillary refill less than 2 seconds in all fingers.  Extensor and flexor mechanisms of the fingers of the left hand are intact, and patient has no sensory deficits.   Initiation of care has begun. The patient has been counseled on the process, plan, and necessity for staying for the completion/evaluation, and the remainder of the medical screening examination    Dana ChimesMurray, Dana [name] B, PA-C 01/16/18 1744    Margarita Todd, Danielle, MD 01/19/18 1451

## 2018-01-16 NOTE — ED Notes (Signed)
See provider assessment 

## 2018-01-16 NOTE — ED Provider Notes (Signed)
[facility] EMERGENCY DEPARTMENT Provider Note   CSN: [id number] Arrival date & time: 01/16/18  1730     History   Chief Complaint Chief Complaint  Patient presents with  . Laceration    HPI Dana Todd is a 32 y.o. female presenting for laceration to her left forearm.  Patient states that she was at work when a coffee pot "exploded" in her hands over the sink lacerating the volar aspect of her left wrist.  Patient states that she immediately covered the area and applied direct pressure pressure.  Patient states that she is having some sharp pain that is 3/10 in severity to the area worse with palpation.  Prior to arrival patient did not remove the dressing.  Patient denies numbness or weakness or tingling to her hand or wrist.  Patient states that her last tetanus shot was in 2016.  HPI  Past Medical History:  Diagnosis Date  . Anxiety   . Depression     Patient Active Problem List   Diagnosis Date Noted  . Anxiety and depression 02/24/2017    Past Surgical History:  Procedure Laterality Date  . NO PAST SURGERIES       OB History   None      Home Medications    Prior to Admission medications   Medication Sig Start Date End Date Taking? Authorizing Provider  cephALEXin (KEFLEX) 500 MG capsule Take 1 capsule (500 mg total) by mouth 4 (four) times daily for 7 days. 01/16/18 01/23/18  Harlene Salts A, PA-C  ondansetron (ZOFRAN ODT) 8 MG disintegrating tablet Take 1 tablet (8 mg total) by mouth every 8 (eight) hours as needed for nausea or vomiting. 11/11/17   Jaynie Crumble, PA-C    Family History Family History  Problem Relation Age of Onset  . Bipolar disorder Mother   . Schizophrenia Mother   . Anxiety disorder Mother   . Anxiety disorder Brother   . Depression Brother   . Post-traumatic stress disorder Brother   . Schizophrenia Maternal Grandfather   . Suicidality Neg Hx     Social History Social History   Tobacco Use    . Smoking status: Never Smoker  . Smokeless tobacco: Never Used  Substance Use Topics  . Alcohol use: Yes    Alcohol/week: 1.0 standard drinks    Types: 1 Glasses of wine per week    Comment: one drink rarely on weekends  . Drug use: No     Allergies   Pork-derived products   Review of Systems Review of Systems  Constitutional: Negative.  Negative for chills, fatigue and fever.  Skin: Positive for wound. Negative for color change.  Neurological: Negative.  Negative for dizziness, syncope, weakness, numbness and headaches.     Physical Exam Updated Vital Signs BP 125/79 (BP Location: Right Arm)   Pulse 83   Temp 99.6 F (37.6 C) (Oral)   Resp 18   LMP 12/26/2017   SpO2 100%   Physical Exam  Constitutional: She is oriented to person, place, and time. She appears well-developed and well-nourished. No distress.  HENT:  Head: Normocephalic and atraumatic.  Right Ear: External ear normal.  Left Ear: External ear normal.  Nose: Nose normal.  Eyes: Pupils are equal, round, and reactive to light. EOM are normal.  Neck: Trachea normal and normal range of motion. No tracheal deviation present.  Pulmonary/Chest: Effort normal. No respiratory distress.  Musculoskeletal: Normal range of motion.       Left wrist:  She exhibits laceration. She exhibits normal range of motion and no swelling.       Right hand: She exhibits normal range of motion, no tenderness, normal two-point discrimination, normal capillary refill, no deformity and no swelling. Normal sensation noted. Normal strength noted.  Left wrist with laceration as noted below.  Left hand: No gross deformities, skin intact. Fingers appear normal.  No snuffbox tenderness to palpation. No tenderness to palpation over flexor sheath.  Finger adduction/abduction intact with 5/5 strength.  Thumb opposition intact. Full active and resisted ROM to flexion/extension at wrist, MCP, PIP and DIP of all fingers.  FDS/FDP intact. Grip  5/5 strength.  Radial artery 2+ with <2sec cap refill in all fingers.  Sensation intact to light-tough in median/ulnar/radial distributions.   Neurological: She is alert and oriented to person, place, and time. No sensory deficit.  Skin: Skin is warm and dry. Capillary refill takes less than 2 seconds.     Psychiatric: She has a normal mood and affect. Her behavior is normal.     ED Treatments / Results  Labs (all labs ordered are listed, but only abnormal results are displayed) Labs Reviewed - No data to display  EKG None  Radiology Dg Forearm Left  Result Date: 01/16/2018 CLINICAL DATA:  Laceration from coffee pot. EXAM: LEFT FOREARM - 2 VIEW COMPARISON:  None. FINDINGS: There is no evidence of fracture or other focal bone lesions. Soft tissues are unremarkable. IMPRESSION: No evidence for radiopaque soft tissue foreign body. Glass can be occult on x-ray. Electronically Signed   By: Kennith CenterEric  Mansell M.D.   On: 01/16/2018 18:08   Dg Wrist Complete Left  Result Date: 01/16/2018 CLINICAL DATA:  Laceration. EXAM: LEFT WRIST - COMPLETE 3+ VIEW COMPARISON:  None. FINDINGS: No worrisome lytic or sclerotic osseous abnormality. No evidence for fracture or dislocation. No radiopaque soft tissue foreign body evident. IMPRESSION: No acute bony abnormality. No radiopaque soft tissue foreign body. Glass can be occult on x-ray. Electronically Signed   By: Kennith CenterEric  Mansell M.D.   On: 01/16/2018 18:07    Procedures .Marland Kitchen.Laceration Repair Date/Time: 01/16/2018 10:28 PM Performed by: Bill SalinasMorelli, Brandon A, PA-C Authorized by: Bill SalinasMorelli, Brandon A, PA-C   Consent:    Consent obtained:  Verbal   Consent given by:  Patient and spouse   Risks discussed:  Infection, pain, retained foreign body, tendon damage, poor cosmetic result, need for additional repair, nerve damage, poor wound healing and vascular damage Anesthesia (see MAR for exact dosages):    Anesthesia method:  Local infiltration   Local anesthetic:   Lidocaine 2% WITH epi Laceration details:    Location:  Shoulder/arm   Shoulder/arm location:  L lower arm   Length (cm):  3.5   Depth (mm):  2 Repair type:    Repair type:  Simple Pre-procedure details:    Preparation:  Patient was prepped and draped in usual sterile fashion and imaging obtained to evaluate for foreign bodies Exploration:    Hemostasis achieved with:  Direct pressure and epinephrine   Wound exploration: wound explored through full range of motion and entire depth of wound probed and visualized     Wound extent: foreign bodies/material     Wound extent: no muscle damage noted, no nerve damage noted, no tendon damage noted, no underlying fracture noted and no vascular damage noted     Wound extent comment:  Small amount of adipose tissue present in the middle part of the laceration.   Foreign bodies/material:  Coffee grounds  Contaminated: yes   Treatment:    Area cleansed with:  Hibiclens and saline   Amount of cleaning:  Extensive   Irrigation solution:  Sterile saline   Irrigation volume:  1 Liter   Irrigation method:  Pressure wash   Visualized foreign bodies/material removed: yes   Skin repair:    Repair method:  Sutures   Suture size:  4-0   Suture material:  Prolene   Suture technique:  Simple interrupted   Number of sutures:  5 Approximation:    Approximation:  Close Post-procedure details:    Dressing:  Antibiotic ointment, sterile dressing and non-adherent dressing   Patient tolerance of procedure:  Tolerated well, no immediate complications   (including critical care time)  Medications Ordered in ED Medications  lidocaine-EPINEPHrine (XYLOCAINE W/EPI) 2 %-1:200000 (PF) injection 10 mL (10 mLs Infiltration Given 01/16/18 2114)  oxyCODONE-acetaminophen (PERCOCET/ROXICET) 5-325 MG per tablet 1 tablet (1 tablet Oral Given 01/16/18 2114)     Initial Impression / Assessment and Plan / ED Course  I have reviewed the triage vital signs and the nursing  notes.  Pertinent labs & imaging results that were available during my care of the patient were reviewed by me and considered in my medical decision making (see chart for details).    Dana Todd is a 32 y.o. female who presents to ED for laceration of the left wrist. Wound thoroughly cleaned in ED today. Wound explored and bottom of wound seen in a bloodless field. Laceration repaired as dictated above. Patient counseled on home wound care. Follow up with PCP/urgent care or return to ER for suture removal in 7 days.  Wound was contaminated with coffee grounds, wound thoroughly irrigated however due to the extent of contamination I have prescribed the patient a course of Keflex.  Patient was urged to return to the Emergency Department for worsening pain, swelling, expanding erythema especially if it streaks away from the affected area, fever, or for any additional concerns. Patient verbalized understanding of return precautions. All questions answered.  At this time there does not appear to be any evidence of an acute emergency medical condition and the patient appears stable for discharge with appropriate outpatient follow up. Diagnosis was discussed with patient who verbalizes understanding of care plan and is agreeable to discharge. I have discussed return precautions with patient and family at bedside who verbalize understanding of return precautions. Patient strongly encouraged to follow-up with their PCP. All questions answered.  Patient's case discussed with Dr. Anitra LauthPlunkett who agrees with plan to discharge, Keflex and PCP follow-up.    Note: Portions of this report may have been transcribed using voice recognition software. Every effort was made to ensure accuracy; however, inadvertent computerized transcription errors may still be present.    Final Clinical Impressions(s) / ED Diagnoses   Final diagnoses:  Laceration of left forearm, initial encounter    ED Discharge Orders          Ordered    cephALEXin (KEFLEX) 500 MG capsule  4 times daily     01/16/18 2235           Elizabeth PalauMorelli, Brandon A, PA-C 01/16/18 2243    Gwyneth SproutPlunkett, Whitney, MD 01/16/18 [id number]

## 2018-01-16 NOTE — ED Triage Notes (Signed)
Pt in with laceration to her left wrist after her coffee pot exploded, pressure dressing applied in triage

## 2018-01-16 NOTE — Discharge Instructions (Addendum)
Please go to your primary care provider in 7 days for wound check and suture removal.  If you cannot follow-up with your primary care provider you may return to the ED for suture removal. There was no foreign body seen in your wound today on direct visualization or x-ray; however this does not mean that there could not still be a unseen foreign body, please return for any concerning symptoms including increased pain, fever, redness around the injury, drainage, streaking, swelling. Please return to the emergency department for any new or worsening symptoms. Please take all of your antibiotic medications, Keflex as prescribed.  Contact a health care provider if: You received a tetanus shot and you have swelling, severe pain, redness, or bleeding at the injection site. You have a fever. A wound that was closed breaks open. You notice a bad smell coming from the wound. You notice something coming out of the wound, such as wood or glass. Your pain is not controlled with medicine. You have increased redness, swelling, or pain at the site of your wound. You have fluid, blood, or pus coming from your wound. You notice a change in the color of your skin near your wound. You need to change the dressing frequently due to fluid, blood, or pus draining from the wound. You develop a new rash. You develop numbness around the wound. Get help right away if: You develop severe swelling around the injury site. Your pain suddenly increases and is severe. You develop painful lumps near the wound or on skin that is anywhere on your body. You have a red streak going away from your wound. The wound is on your hand or foot and you cannot properly move a finger or toe. The wound is on your hand or foot and you notice that your fingers or toes look pale or bluish.

## 2018-01-23 ENCOUNTER — Encounter: Payer: Self-pay | Admitting: Family Medicine

## 2018-01-23 ENCOUNTER — Ambulatory Visit (INDEPENDENT_AMBULATORY_CARE_PROVIDER_SITE_OTHER): Payer: 59 | Admitting: Family Medicine

## 2018-01-23 ENCOUNTER — Other Ambulatory Visit: Payer: Self-pay

## 2018-01-23 VITALS — BP 124/86 | HR 90 | Temp 99.0°F | Resp 17 | Ht 61.75 in | Wt 201.2 lb

## 2018-01-23 DIAGNOSIS — Z4802 Encounter for removal of sutures: Secondary | ICD-10-CM

## 2018-01-23 DIAGNOSIS — L0591 Pilonidal cyst without abscess: Secondary | ICD-10-CM | POA: Diagnosis not present

## 2018-01-23 NOTE — Patient Instructions (Signed)
Pilonidal Cyst A pilonidal cyst is a fluid-filled sac. It forms beneath the skin near your tailbone, at the top of the crease of your buttocks. A pilonidal cyst that is not large or infected may not cause symptoms or problems. If the cyst becomes irritated or infected, it may fill with pus. This causes pain and swelling (pilonidal abscess). An infected cyst may need to be treated with medicine, drained, or removed. What are the causes? The cause of a pilonidal cyst is not known. One cause may be a hair that grows into your skin (ingrown hair). What increases the risk? Pilonidal cysts are more common in boys and men. Risk factors include:  Having lots of hair near the crease of the buttocks.  Being overweight.  Having a pilonidal dimple.  Wearing tight clothing.  Not bathing or showering frequently.  Sitting for long periods of time.  What are the signs or symptoms? Signs and symptoms of a pilonidal cyst may include:  Redness.  Pain and tenderness.  Warmth.  Swelling.  Pus.  Fever.  How is this diagnosed? Your health care provider may diagnose a pilonidal cyst based on your symptoms and a physical exam. The health care provider may do a blood test to check for infection. If your cyst is draining pus, your health care provider may take a sample of the drainage to be tested at a laboratory. How is this treated? Surgery is the usual treatment for an infected pilonidal cyst. You may also have to take medicines before surgery. The type of surgery you have depends on the size and severity of the infected cyst. The different kinds of surgery include:  Incision and drainage. This is a procedure to open and drain the cyst.  Marsupialization. In this procedure, a large cyst or abscess may be opened and kept open by stitching the edges of the skin to the cyst walls.  Cyst removal. This procedure involves opening the skin and removing all or part of the cyst.  Follow these  instructions at home:  Follow all of your surgeon's instructions carefully if you had surgery.  Take medicines only as directed by your health care provider.  If you were prescribed an antibiotic medicine, finish it all even if you start to feel better.  Keep the area around your pilonidal cyst clean and dry.  Clean the area as directed by your health care provider. Pat the area dry with a clean towel. Do not rub it as this may cause bleeding.  Remove hair from the area around the cyst as directed by your health care provider.  Do not wear tight clothing or sit in one place for long periods of time.  There are many different ways to close and cover an incision, including stitches, skin glue, and adhesive strips. Follow your health care provider's instructions on: ? Incision care. ? Bandage (dressing) changes and removal. ? Incision closure removal. Contact a health care provider if:  You have drainage, redness, swelling, or pain at the site of the cyst.  You have a fever. This information is not intended to replace advice given to you by your health care provider. Make sure you discuss any questions you have with your health care provider. Document Released: 05/21/2000 Document Revised: 10/30/2015 Document Reviewed: 10/11/2013 Elsevier Interactive Patient Education  2018 Elsevier Inc.  

## 2018-01-23 NOTE — Progress Notes (Addendum)
Chief Complaint  Patient presents with  . Suture / Staple Removal  . cyst on tailbone    recurrent and pt would like to make sure it is not infected    HPI  Pt reports that she has had 9 episodes of pilonidal cysts She can feel this one starting She is on keflex from the ER for a arm laceration requiring stiches She works from home and sits for 10 hrs a day She has thick curly hair She has recently gained weight She states that she keeps going to urgent care because she can never get in with her PCP  She states that she lives with the pain and gets multiple rounds of antibiotics   Past Medical History:  Diagnosis Date  . Anxiety   . Depression     Current Outpatient Medications  Medication Sig Dispense Refill  . cephALEXin (KEFLEX) 500 MG capsule Take 1 capsule (500 mg total) by mouth 4 (four) times daily for 7 days. 28 capsule 0  . ondansetron (ZOFRAN ODT) 8 MG disintegrating tablet Take 1 tablet (8 mg total) by mouth every 8 (eight) hours as needed for nausea or vomiting. (Patient not taking: Reported on 01/23/2018) 10 tablet 0   No current facility-administered medications for this visit.     Allergies:  Allergies  Allergen Reactions  . Pork-Derived Products     Past Surgical History:  Procedure Laterality Date  . NO PAST SURGERIES      Social History   Socioeconomic History  . Marital status: Single    Spouse name: Not on file  . Number of children: 0  . Years of education: 7315  . Highest education level: Not on file  Occupational History  . Occupation: Scientist, product/process developmenttechnical support    Comment: At home for [employer]  Social Needs  . Financial resource strain: Not on file  . Food insecurity:    Worry: Not on file    Inability: Not on file  . Transportation needs:    Medical: Not on file    Non-medical: Not on file  Tobacco Use  . Smoking status: Never Smoker  . Smokeless tobacco: Never Used  Substance and Sexual Activity  . Alcohol use: Yes    Alcohol/week: 1.0  standard drinks    Types: 1 Glasses of wine per week    Comment: one drink rarely on weekends  . Drug use: No  . Sexual activity: Yes    Birth control/protection: None  Lifestyle  . Physical activity:    Days per week: Not on file    Minutes per session: Not on file  . Stress: Not on file  Relationships  . Social connections:    Talks on phone: Not on file    Gets together: Not on file    Attends religious service: Not on file    Active member of club or organization: Not on file    Attends meetings of clubs or organizations: Not on file    Relationship status: Not on file  Other Topics Concern  . Not on file  Social History Narrative   [place]ives in [place], alone, has one dog. Never been married, no kids. 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 Grew up in [place] and was raised by mom. Has 2 brother and pt is middle child. 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     Family History  Problem Relation Age of Onset  . Bipolar disorder  Mother   . Schizophrenia Mother   . Anxiety disorder Mother   . Anxiety disorder Brother   . Depression Brother   . Post-traumatic stress disorder Brother   . Schizophrenia Maternal Grandfather   . Suicidality Neg Hx      ROS Review of Systems See HPI Constitution: No fevers or chills No malaise No diaphoresis Skin: No rash or itching Eyes: no blurry vision, no double vision GU: no dysuria or hematuria Neuro: no dizziness or headaches all others reviewed and negative   Objective: Vitals:   01/23/18 1640  BP: 124/86  Pulse: 90  Resp: 17  Temp: 99 F (37.2 C)  TempSrc: Oral  SpO2: 99%  Weight: 201 lb 3.2 oz (91.3 kg)  Height: 5' 1.75" (1.568 m)    Physical Exam  Constitutional: She is oriented to person, place, and time. She appears well-developed and well-nourished.  HENT:  Head: Normocephalic and atraumatic.  Eyes: Conjunctivae and EOM are normal.  Pulmonary/Chest: Effort normal.    Neurological: She is alert and oriented to person, place, and time.  Skin: Skin is warm. Capillary refill takes less than 2 seconds.  Psychiatric: She has a normal mood and affect. Her behavior is normal. Judgment and thought content normal.  noted tenderness in the gluteal crease previous scar noted from previous I&D  Right wrist shows  Assessment and Plan Dana Todd was seen today for suture / staple removal and cyst on tailbone.  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

## 2018-02-02 ENCOUNTER — Ambulatory Visit: Payer: Self-pay | Admitting: Surgery

## 2018-02-02 DIAGNOSIS — L988 Other specified disorders of the skin and subcutaneous tissue: Secondary | ICD-10-CM | POA: Diagnosis not present

## 2018-02-02 NOTE — H&P (Signed)
Dana Todd Documented: 02/02/2018 11:07 AM Location: [facility] Patient #: [id number] DOB: May 25, 1986 Single / Language: Dana Todd / Race: Black or African American Female  History of Present Illness (Sansa Alkema A. Fredricka Bonine MD; 02/02/2018 11:42 AM) Patient words: Otherwise healthy 31yo woman with recurrent pilonidal cysts, had had it lanced 9 times since first developing the problem at age 38. She develops pain and swelling in the area and drains yellow purulent fluid. The last time it was drained was about a year ago. She is very concerned because some of her other female family members have what sounds like axillary hidradenitis, and she does have one cousin that had similar pilonidal disease and had such a bad infection develop that she was in a coma and is now paralyzed from the waist down... She used to shave the area but has not been doing hair removal lately.  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  The patient is a 32 year old female.   Past Surgical History Adela Lank Haggett; 02/02/2018 11:08 AM) No pertinent past surgical history  Diagnostic Studies History Adela Lank Haggett; 02/02/2018 11:08 AM) Colonoscopy never Mammogram never Pap Smear 1-5 years ago  Allergies Adela Lank Haggett; 02/02/2018 11:09 AM) No Known Drug Allergies [02/02/2018]: Allergies Reconciled  Medication History (Jacqueline Haggett; 02/02/2018 11:09 AM) Vitamin D3 (Oral) Specific strength unknown - Active. Medications Reconciled  Social History Adela Lank Haggett; 02/02/2018 11:08 AM) Alcohol use Occasional alcohol use. Caffeine use Coffee. No drug use Tobacco use Never smoker.  Family History Adela Lank Haggett; 02/02/2018 11:08 AM) Alcohol Abuse Father, Mother. Hypertension Mother. Seizure disorder Brother.  Pregnancy / Birth History Adela Lank Haggett; 02/02/2018 11:08 AM) Age at menarche 13  years. Contraceptive History Contraceptive implant. Gravida 0 Para 0 Regular periods  Other Problems Adela Lank Haggett; 02/02/2018 11:08 AM) Anxiety Disorder Other disease, cancer, significant illness     Review of Systems (Tyleek Smick A. Fredricka Bonine MD; 02/02/2018 11:42 AM) General Present- Weight Gain. Not Present- Appetite Loss, Chills, Fatigue, Fever, Night Sweats and Weight Loss. Skin Not Present- Change in Wart/Mole, Dryness, Hives, Jaundice, New Lesions, Non-Healing Wounds, Rash and Ulcer. HEENT Not Present- Earache, Hearing Loss, Hoarseness, Nose Bleed, Oral Ulcers, Ringing in the Ears, Seasonal Allergies, Sinus Pain, Sore Throat, Visual Disturbances, Wears glasses/contact lenses and Yellow Eyes. Respiratory Not Present- Bloody sputum, Chronic Cough, Difficulty Breathing, Snoring and Wheezing. Breast Present- Breast Pain. Not Present- Breast Mass, Nipple Discharge and Skin Changes. Cardiovascular Not Present- Chest Pain, Difficulty Breathing Lying Down, Leg Cramps, Palpitations, Rapid Heart Rate, Shortness of Breath and Swelling of Extremities. Gastrointestinal Not Present- Abdominal Pain, Bloating, Bloody Stool, Change in Bowel Habits, Chronic diarrhea, Constipation, Difficulty Swallowing, Excessive gas, Gets full quickly at meals, Hemorrhoids, Indigestion, Nausea, Rectal Pain and Vomiting. Female Genitourinary Not Present- Frequency, Nocturia, Painful Urination, Pelvic Pain and Urgency. Musculoskeletal Not Present- Back Pain, Joint Pain, Joint Stiffness, Muscle Pain, Muscle Weakness and Swelling of Extremities. Neurological Not Present- Decreased Memory, Fainting, Headaches, Numbness, Seizures, Tingling, Tremor, Trouble walking and Weakness. Psychiatric Present- Anxiety. Not Present- Bipolar, Change in Sleep Pattern, Depression, Fearful and Frequent crying. Endocrine Not Present- Cold Intolerance, Excessive Hunger, Hair Changes, Heat Intolerance, Hot flashes and New  Diabetes. Hematology Not Present- Blood Thinners, Easy Bruising, Excessive bleeding, Gland problems, HIV and Persistent Infections. All other systems negative  Vitals Adela Lank Haggett; 02/02/2018 11:10 AM) 02/02/2018 11:09 AM Weight: 203.2 lb Height: 60in Height was reported by patient. Body Surface Area: 1.88 m  Body Mass Index: 39.68 kg/m  Temp.: 97.41F(Temporal)  Pulse: 89 (Regular)  P.OX: 99% (Room air) BP: 124/78 (Sitting, Left Arm, Standard)      Physical Exam (Konya Fauble A. Fredricka Bonineonnor MD; 02/02/2018 11:43 AM)  The physical exam findings are as follows: Note:Gen: alert and well appearing Eye: extraocular motion intact, no scleral icterus ENT: moist mucus membranes, dentition intact Neck: no mass or thyromegaly Chest: unlabored respirations, symmetrical air entry, clear bilaterally CV: regular rate and rhythm, no pedal edema Abdomen: soft, nontender, nondistended. No mass or organomegaly MSK: strength symmetrical throughout, no deformity Neuro: grossly intact, normal gait Psych: normal mood and affect, appropriate insight Skin: warm and dry, no rash or lesion on limited exam. At the superior needle cleft there are one or 2 very small pits. There are scars on either side which are very subtle, there is no palpable residual cyst or deep scar tissue.    Assessment & Plan (Anaiza Behrens A. Fredricka Bonineonnor MD; 02/02/2018 11:44 AM)  PILONIDAL DISEASE (L98.8) Story: We discussed the options of local excision with primary closure versus heal by secondary intention, marsupialization, or wide excision with flap coverage. I recommended marsupialization and we discussed that this entails an open wound that requires daily dressing changes for 6-8 weeks but that the risk of recurrence is very low. Recommended that she start hair removal now we discussed options of clippers, depilatory creams or waxing and she was going to start waxing. Discussed that carrying this forward after surgery will reduce  the chance of recurrent disease in the area. I also discussed with her the option of simply doing hair removal now and that I will reduce the chance of her having another flare but she very much wants to proceed with surgery. We discussed risks of surgery and her questions were answered to her satisfaction she would like to schedule as soon as possible.

## 2018-04-06 ENCOUNTER — Emergency Department (HOSPITAL_COMMUNITY): Payer: 59

## 2018-04-06 ENCOUNTER — Emergency Department (HOSPITAL_COMMUNITY)
Admission: EM | Admit: 2018-04-06 | Discharge: 2018-04-06 | Disposition: A | Discharge status: Home / Self Care | Payer: 59 | Attending: Emergency Medicine | Admitting: Emergency Medicine

## 2018-04-06 ENCOUNTER — Encounter (HOSPITAL_COMMUNITY): Payer: Self-pay | Admitting: Internal Medicine

## 2018-04-06 ENCOUNTER — Other Ambulatory Visit: Payer: Self-pay

## 2018-04-06 DIAGNOSIS — Z79899 Other long term (current) drug therapy: Secondary | ICD-10-CM | POA: Diagnosis not present

## 2018-04-06 DIAGNOSIS — K449 Diaphragmatic hernia without obstruction or gangrene: Secondary | ICD-10-CM

## 2018-04-06 DIAGNOSIS — R0602 Shortness of breath: Secondary | ICD-10-CM

## 2018-04-06 DIAGNOSIS — F419 Anxiety disorder, unspecified: Secondary | ICD-10-CM

## 2018-04-06 LAB — COMPREHENSIVE METABOLIC PANEL
ALBUMIN: 3.4 g/dL — AB (ref 3.5–5.0)
ALT: 11 U/L (ref 0–44)
AST: 17 U/L (ref 15–41)
Alkaline Phosphatase: 66 U/L (ref 38–126)
Anion gap: 6 (ref 5–15)
BUN: 9 mg/dL (ref 6–20)
CHLORIDE: 108 mmol/L (ref 98–111)
CO2: 23 mmol/L (ref 22–32)
CREATININE: 0.83 mg/dL (ref 0.44–1.00)
Calcium: 8.9 mg/dL (ref 8.9–10.3)
GFR calc Af Amer: >60 mL/min (ref >60)
GFR calc non Af Amer: >60 mL/min (ref >60)
GLUCOSE: 113 mg/dL — AB (ref 70–99)
POTASSIUM: 4.3 mmol/L (ref 3.5–5.1)
Sodium: 137 mmol/L (ref 135–145)
Total Bilirubin: 0.2 mg/dL — ABNORMAL LOW (ref 0.3–1.2)
Total Protein: 6.8 g/dL (ref 6.5–8.1)

## 2018-04-06 LAB — CBC WITH DIFFERENTIAL/PLATELET
ABS IMMATURE GRANULOCYTES: 0.04 10*3/uL (ref 0.00–0.07)
BASOS ABS: 0 10*3/uL (ref 0.0–0.1)
BASOS PCT: 0 %
EOS ABS: 0 10*3/uL (ref 0.0–0.5)
Eosinophils Relative: 0 %
HCT: 34.8 % — ABNORMAL LOW (ref 36.0–46.0)
Hemoglobin: 11.1 g/dL — ABNORMAL LOW (ref 12.0–15.0)
IMMATURE GRANULOCYTES: 0 %
Lymphocytes Relative: 14 %
Lymphs Abs: 1.4 10*3/uL (ref 0.7–4.0)
MCH: 29.3 pg (ref 26.0–34.0)
MCHC: 31.9 g/dL (ref 30.0–36.0)
MCV: 91.8 fL (ref 80.0–100.0)
MONOS PCT: 7 %
Monocytes Absolute: 0.7 10*3/uL (ref 0.1–1.0)
NEUTROS ABS: 7.8 10*3/uL — AB (ref 1.7–7.7)
NEUTROS PCT: 79 %
NRBC: 0 % (ref 0.0–0.2)
PLATELETS: 269 10*3/uL (ref 150–400)
RBC: 3.79 MIL/uL — ABNORMAL LOW (ref 3.87–5.11)
RDW: 13.6 % (ref 11.5–15.5)
WBC: 9.9 10*3/uL (ref 4.0–10.5)

## 2018-04-06 LAB — I-STAT BETA HCG BLOOD, ED (MC, WL, AP ONLY): I-stat hCG, quantitative: <5 m[IU]/mL (ref <5)

## 2018-04-06 LAB — HEMOGLOBIN A1C
HEMOGLOBIN A1C: 5.5 % (ref 4.8–5.6)
Mean Plasma Glucose: 111.15 mg/dL

## 2018-04-06 MED ORDER — FAMOTIDINE 20 MG PO TABS: 20.0000 mg | ORAL_TABLET | Freq: Every day | ORAL | 0 refills | Status: DC

## 2018-04-06 MED ORDER — IOPAMIDOL (ISOVUE-370) INJECTION 76%
100.0000 mL | Freq: Once | INTRAVENOUS | Status: AC | PRN
  Administered 2018-04-06: 100 mL via INTRAVENOUS

## 2018-04-06 MED ORDER — SODIUM CHLORIDE 0.9 % IV BOLUS
1000.0000 mL | Freq: Once | INTRAVENOUS | Status: AC
  Administered 2018-04-06: 1000 mL via INTRAVENOUS

## 2018-04-06 MED ORDER — LIDOCAINE VISCOUS HCL 2 % MT SOLN
15.0000 mL | Freq: Once | OROMUCOSAL | Status: AC
  Administered 2018-04-06: 15 mL via OROMUCOSAL
  Filled 2018-04-06: qty 15

## 2018-04-06 MED ORDER — IOPAMIDOL (ISOVUE-370) INJECTION 76%
INTRAVENOUS | Status: AC
  Filled 2018-04-06: qty 100

## 2018-04-06 NOTE — ED Triage Notes (Signed)
Pt here for evaluation of shortness of breath x2 hours this morning. States "I've never been diagnosed with asthma, but this feels like an asthma attack. I feel dehydrated." Pt appears to be anxious and reports increased stress at her job recently. HR 144.

## 2018-04-06 NOTE — ED Notes (Signed)
Patient verbalizes understanding of discharge instructions. Opportunity for questioning and answers were provided. Armband removed by staff, pt discharged from ED.  

## 2018-04-06 NOTE — ED Notes (Signed)
Patient ambulatory to bathroom with steady gait at this time 

## 2018-04-06 NOTE — Discharge Instructions (Addendum)
Take pepcid daily. Make sure you are staying well-hydrated with water. Be careful with your diet.  Avoid spicy, greasy, fatty, acidic foods.  Eat smaller, more frequent meals throughout the day.  After eating, make sure you remain upright for at least 60 minutes before lying down. Follow-up with your primary care doctor for further evaluation of your symptoms. Return to the emergency room with any new, worsening, or concerning symptoms.

## 2018-04-06 NOTE — ED Provider Notes (Addendum)
[facility] EMERGENCY DEPARTMENT Provider Note   CSN: [id number] Arrival date & time: 04/06/18  4098     History   Chief Complaint Chief Complaint  Patient presents with  . Shortness of Breath    HPI Dana Todd is a 32 y.o. female presenting for evaluation of shortness of breath.  Patient states that since she got back from the [place], she has had 3 episodes of shortness of breath and anxiety which have landed her in the emergency room.  Patient states she will wake up, feeling like she cannot breathe, causing her to have a panic attack.  Patient has had anxiety in the past, but never like this.  Patient states she has intermittent periods where she feels short of breath, but does not feel anxious.  She denies trauma, injury, immobilization, leg pain/swelling, history of cancer, history of previous DVT/PE, or hormone use.  Patient states that she has been treated for anxiety in the past, but it has been several years.  She was unable to tolerate the side effects of the medications.  Additionally, patient reports she does have GERD.  Has been needing to increase the amount of Tums that she is taking, is not on a daily antacid medicine.  Patient states she has gained weight recently, and had increased stress.  She denies fevers, chills, sore throat, cough, chest pain, nausea, vomiting, abdominal pain, urinary symptoms, abnormal bowel movements. Pt states she is drinking a lot of water, about 1/2 gallon a day.  She has had issues with dehydration in the past, but states that she feels very thirsty still.  Family history of diabetes. States she takes no medications daily.  Additional history obtained from chart review.  Patient with a history of anxiety and depression, for which she is no longer medication but was at one point going to therapy.  He has had negative diabetes testing in the past. She has not seen her PCP in 1 yr.   HPI  Past Medical History:    Diagnosis Date  . Anxiety   . Depression     Patient Active Problem List   Diagnosis Date Noted  . Anxiety and depression 02/24/2017    Past Surgical History:  Procedure Laterality Date  . NO PAST SURGERIES       OB History   None      Home Medications    Prior to Admission medications   Medication Sig Start Date End Date Taking? Authorizing Provider  cholecalciferol (VITAMIN D) 1000 units tablet Take 1,000 Units by mouth daily.   Yes [provider]  Cyanocobalamin (VITAMIN B 12 PO) Take 1 tablet by mouth daily.   Yes [provider]  ibuprofen (ADVIL,MOTRIN) 200 MG tablet Take 200 mg by mouth every 6 (six) hours as needed for moderate pain.   Yes [provider]  PARAGARD INTRAUTERINE COPPER IU 1 Device by Intrauterine route once.   Yes [provider]  famotidine (PEPCID) 20 MG tablet Take 1 tablet (20 mg total) by mouth daily. 04/06/18   Alecsander Hattabaugh, PA-C  ondansetron (ZOFRAN ODT) 8 MG disintegrating tablet Take 1 tablet (8 mg total) by mouth every 8 (eight) hours as needed for nausea or vomiting. Patient not taking: Reported on 01/23/2018 11/11/17   Jaynie Crumble, PA-C    Family History Family History  Problem Relation Age of Onset  . Bipolar disorder Mother   . Schizophrenia Mother   . Anxiety disorder Mother   . Anxiety  disorder Brother   . Depression Brother   . Post-traumatic stress disorder Brother   . Schizophrenia Maternal Grandfather   . Suicidality Neg Hx     Social History Social History   Tobacco Use  . Smoking status: Never Smoker  . Smokeless tobacco: Never Used  Substance Use Topics  . Alcohol use: Yes    Alcohol/week: 1.0 standard drinks    Types: 1 Glasses of wine per week    Comment: one drink rarely on weekends  . Drug use: No     Allergies   Pork-derived products   Review of Systems Review of Systems  Respiratory: Positive for chest tightness and shortness of breath.    Endocrine: Positive for polydipsia.  Psychiatric/Behavioral: The patient is nervous/anxious.   All other systems reviewed and are negative.    Physical Exam Updated Vital Signs BP 104/74 (BP Location: Right Arm)   Pulse (!) 105   Temp 100.1 F (37.8 C) (Oral)   Resp 16   Ht 5' (1.524 m)   Wt 93.4 kg   LMP 03/27/2018   SpO2 100%   BMI 40.23 kg/m   Physical Exam  Constitutional: She is oriented to person, place, and time. She appears well-developed and well-nourished. No distress.  Patient appears very anxious.  HENT:  Head: Normocephalic and atraumatic.  MM dry.  Eyes: Pupils are equal, round, and reactive to light. Conjunctivae and EOM are normal.  Neck: Normal range of motion. Neck supple.  Cardiovascular: Regular rhythm and intact distal pulses.  Tachycardic up to 140.  Heart rate increases as patient continues to talk.  Pulmonary/Chest: Effort normal and breath sounds normal. No respiratory distress. She has no wheezes.  Clear lung sounds in all fields.  Speaking in full sentences.  Abdominal: Soft. She exhibits no distension. There is no tenderness. There is no guarding.  Musculoskeletal: Normal range of motion. She exhibits no edema.  No leg pain or swelling.  Pedal pulses intact bilaterally.  Neurological: She is alert and oriented to person, place, and time.  Skin: Skin is warm and dry. Capillary refill takes less than 2 seconds.  Psychiatric: Her mood appears anxious.  Patient appears anxious.  Speech is pressured.  Nursing note and vitals reviewed.    ED Treatments / Results  Labs (all labs ordered are listed, but only abnormal results are displayed) Labs Reviewed  CBC WITH DIFFERENTIAL/PLATELET - Abnormal; Notable for the following components:      Result Value   RBC 3.79 (*)    Hemoglobin 11.1 (*)    HCT 34.8 (*)    Neutro Abs 7.8 (*)    All other components within normal limits  COMPREHENSIVE METABOLIC PANEL - Abnormal; Notable for the following  components:   Glucose, Bld 113 (*)    Albumin 3.4 (*)    Total Bilirubin 0.2 (*)    All other components within normal limits  HEMOGLOBIN A1C  I-STAT BETA HCG BLOOD, ED ([facility], [facility], [facility] ONLY)    EKG None  Radiology Dg Chest 2 View  Result Date: 04/06/2018 CLINICAL DATA:  Shortness of breath. EXAM: CHEST - 2 VIEW COMPARISON:  11/11/2017. FINDINGS: Mediastinum and hilar structures normal. Heart size normal. No focal infiltrate. No pleural effusion or pneumothorax. No acute bony abnormality. IMPRESSION: No acute cardiopulmonary disease. Electronically Signed   By: Maisie Fus  Register   On: 04/06/2018 09:00   Ct Angio Chest Pe W And/or Wo Contrast  Result Date: 04/06/2018 CLINICAL DATA:  Shortness of Breath EXAM: CT  ANGIOGRAPHY CHEST WITH CONTRAST TECHNIQUE: Multidetector CT imaging of the chest was performed using the standard protocol during bolus administration of intravenous contrast. Multiplanar CT image reconstructions and MIPs were obtained to evaluate the vascular anatomy. CONTRAST:  ISOVUE-370 IOPAMIDOL (ISOVUE-370) INJECTION 76% COMPARISON:  Chest radiograph April 06, 2018 FINDINGS: Cardiovascular: There is no demonstrable pulmonary embolus. There is no thoracic aortic aneurysm or dissection. The visualized great vessels appear unremarkable. There is no evident pericardial effusion or pericardial thickening. Mediastinum/Nodes: Visualized thyroid appears normal. There is no appreciable adenopathy. There is a small hiatal hernia. Lungs/Pleura: There is no lung edema or consolidation. There is mild lower lobe atelectatic change bilaterally. No pleural effusion or pleural thickening. Upper Abdomen: Visualized upper abdominal structures appear unremarkable. Musculoskeletal: There are no blastic or lytic bone lesions. No evident chest wall lesion. Review of the MIP images confirms the above findings. IMPRESSION: 1. No demonstrable pulmonary embolus. No thoracic aortic aneurysm or dissection. 2.   Lower lobe atelectatic change.  No edema or consolidation. 3.  No evident thoracic adenopathy. 4.  Small hiatal hernia. Electronically Signed   By: Bretta Bang III M.D.   On: 04/06/2018 13:05    Procedures Procedures (including critical care time)  Medications Ordered in ED Medications  iopamidol (ISOVUE-370) 76 % injection (has no administration in time range)  sodium chloride 0.9 % bolus 1,000 mL (0 mLs Intravenous Stopped 04/06/18 1142)  iopamidol (ISOVUE-370) 76 % injection 100 mL (100 mLs Intravenous Contrast Given 04/06/18 1239)  lidocaine (XYLOCAINE) 2 % viscous mouth solution 15 mL (15 mLs Mouth/Throat Given 04/06/18 1353)     Initial Impression / Assessment and Plan / ED Course  I have reviewed the triage vital signs and the nursing notes.  Pertinent labs & imaging results that were available during my care of the patient were reviewed by me and considered in my medical decision making (see chart for details).     Patient presented for evaluation of shortness of breath, tachycardia, and anxiety.  Initial exam shows patient who is tachycardic and appears very anxious.  Symptoms worsened after a flight from the [place].  No other risk factors for PE, however as this is the third time patient has been in the ER for this, patient with abnormal vitals, will pursue PE r/o.  Sats remain stable, low grade temperature of 100.1.  Will obtain basic labs for further evaluation. Clinically pt appears dehydrated, will give fluids to improve HR and tx possible dehydration.  Patient refusing medication for anxiety at this time. Pt states sxs are more likely to happen when she first wakes up, consider gerd vs osa etiology as well.   CXR viewed and interpreted by me, no pna, pnx, or effusions. Labs reassuring, HgA1c negative.  Kidney function stable.  Hemoglobin stable.  No leukocytosis.  CTA pending.  CTA without PE.  Shows hiatal hernia.  Heart rate has improved to low 90's (increases  slightly to 100s when pt talks about her sxs and plans for d/c). Patient states her symptoms have also improved.  At this time, consider anxiety versus GERD versus OSA or a multifactorial cause.  Discussed findings with patient and family.  Discussed trial of antacids, change in diet, and follow-up with PCP.  At this time, patient appears safe for discharge.  Return precautions given.  Patient states she understands and agrees to plan.  Final Clinical Impressions(s) / ED Diagnoses   Final diagnoses:  Shortness of breath  Hiatal hernia  Anxiety  ED Discharge Orders         Ordered    famotidine (PEPCID) 20 MG tablet  Daily     04/06/18 1339           [place], PA-C 04/06/18 1511    Alveria Apley, PA-C 04/06/18 1512    Raeford Razor, MD 04/06/18 1627

## 2019-09-01 IMAGING — CR DG CHEST 2V
2 series · 2 of 2 positions shown · non-contrast
Comparison: 11/11/2017.

CLINICAL DATA: Shortness of breath.

EXAM:
CHEST - 2 VIEW

[chest lat]
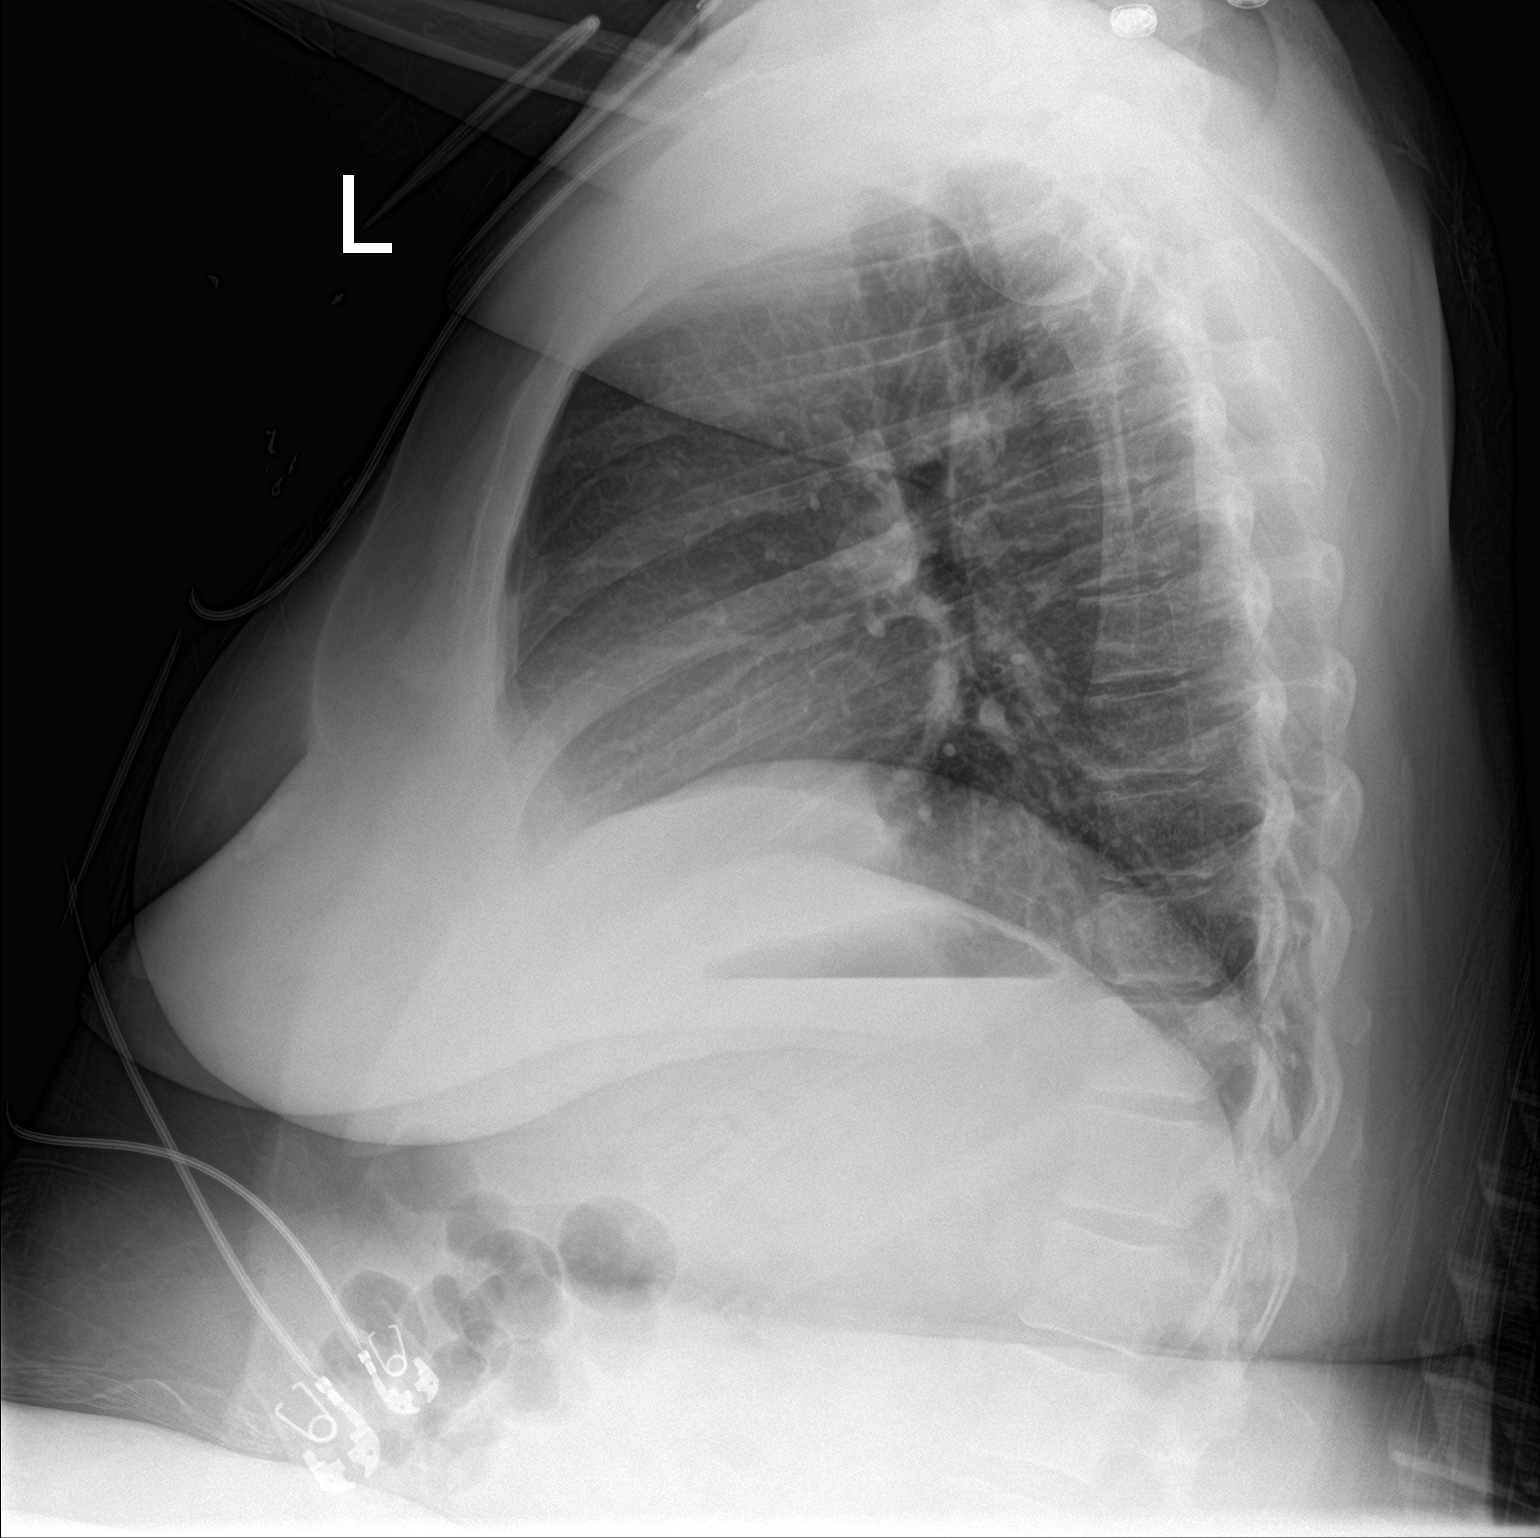

[chest ap]
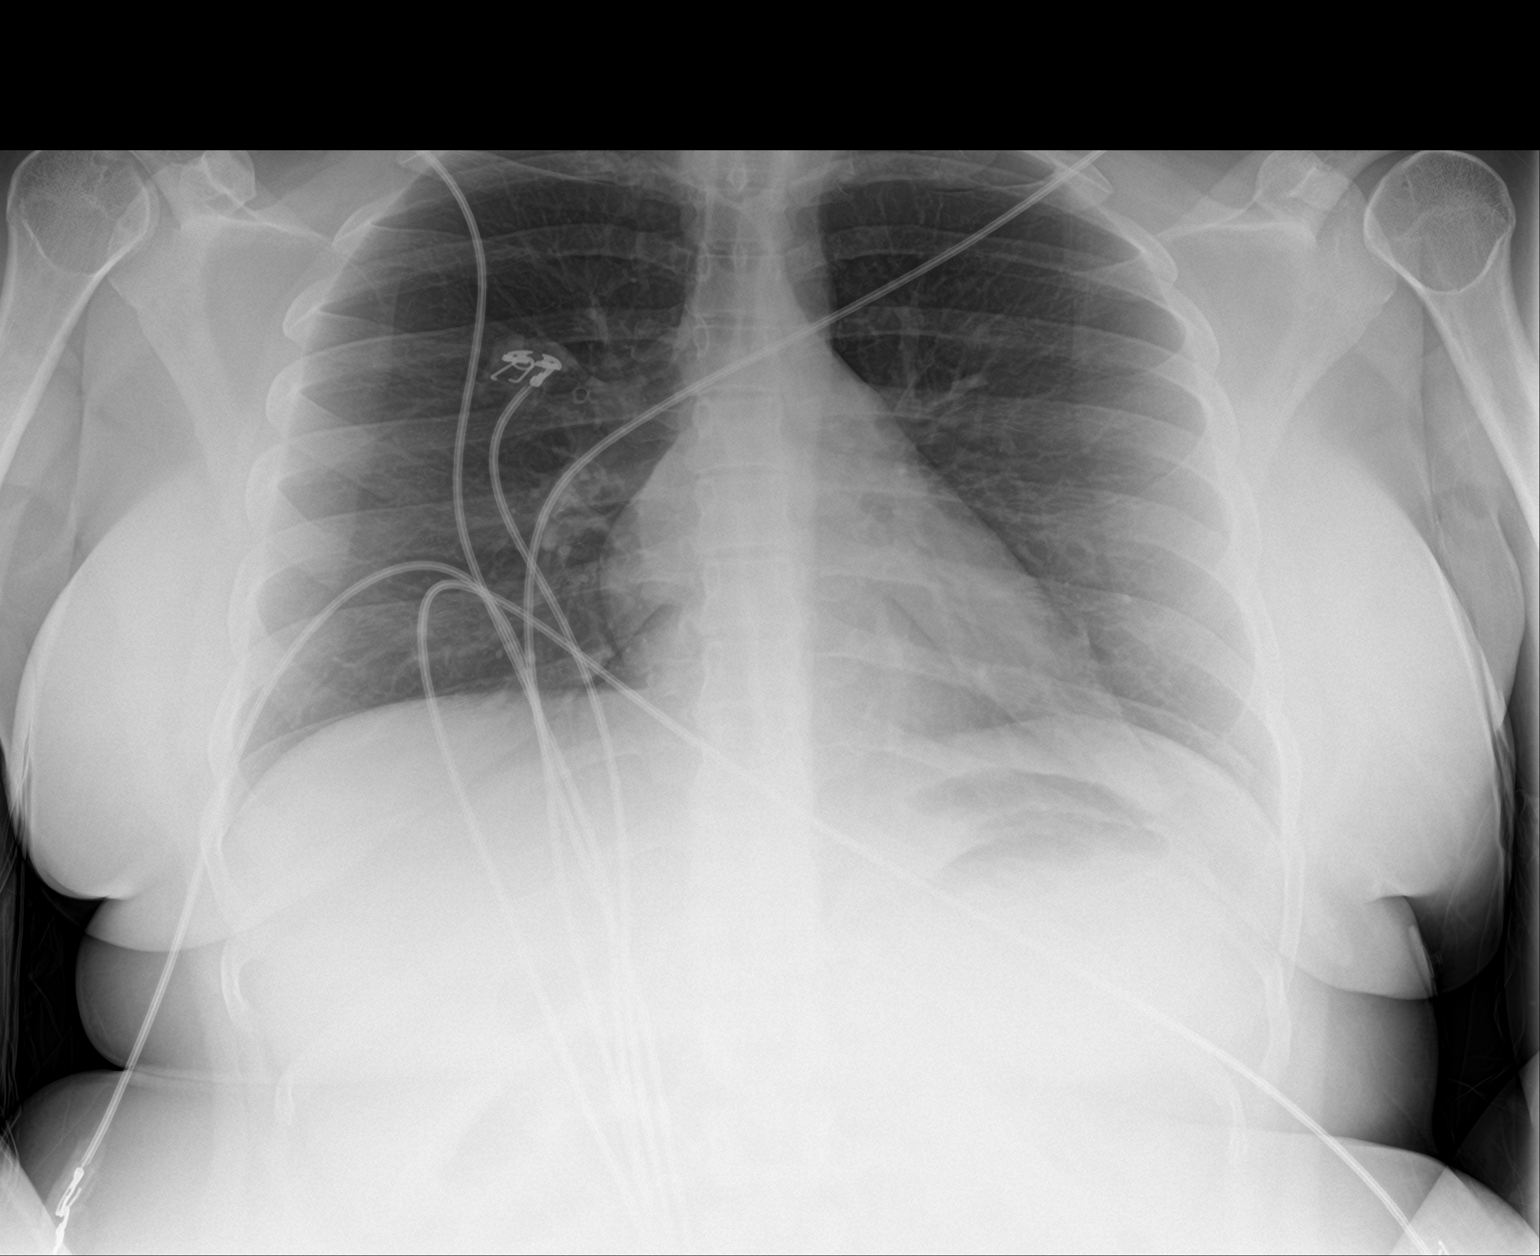

[2 of 2 positions shown; findings below may reference images not displayed]

FINDINGS: Mediastinum and hilar structures normal. Heart size normal. No focal
infiltrate. No pleural effusion or pneumothorax. No acute bony
abnormality.
IMPRESSION: No acute cardiopulmonary disease.

## 2020-02-28 ENCOUNTER — Telehealth (HOSPITAL_COMMUNITY): Payer: Self-pay | Admitting: Psychiatry

## 2020-02-28 NOTE — Telephone Encounter (Signed)
D:  Pt's therapist (Traci Abernathy, LCSW) referred pt to [facility].  A:  Placed call to orient and provide pt with a start date, but there was no answer.  Left vm for pt to call case manager back if interested in group.

## 2020-05-06 ENCOUNTER — Other Ambulatory Visit: Payer: Self-pay

## 2020-05-06 ENCOUNTER — Ambulatory Visit
Admission: EM | Admit: 2020-05-06 | Discharge: 2020-05-06 | Disposition: A | Discharge status: Home / Self Care | Payer: 59

## 2020-05-06 DIAGNOSIS — S0181XA Laceration without foreign body of other part of head, initial encounter: Secondary | ICD-10-CM

## 2020-05-06 DIAGNOSIS — W540XXA Bitten by dog, initial encounter: Secondary | ICD-10-CM

## 2020-05-06 HISTORY — DX: Gastro-esophageal reflux disease without esophagitis: K21.9

## 2020-05-06 MED ORDER — TETANUS-DIPHTH-ACELL PERTUSSIS 5-2.5-18.5 LF-MCG/0.5 IM SUSY
0.5000 mL | PREFILLED_SYRINGE | Freq: Once | INTRAMUSCULAR | Status: AC
  Administered 2020-05-06: 0.5 mL via INTRAMUSCULAR

## 2020-05-06 MED ORDER — AMOXICILLIN-POT CLAVULANATE 875-125 MG PO TABS: 1.0000 | ORAL_TABLET | Freq: Two times a day (BID) | ORAL | 0 refills | Status: DC

## 2020-05-06 NOTE — Discharge Instructions (Signed)
Keep area(s) clean and dry. Apply ice for 20 minutes 5 times daily. Take antibiotic as prescribed with food - important to complete course. Return for worsening pain, redness, swelling, discharge, fever. 

## 2020-05-06 NOTE — ED Triage Notes (Addendum)
Pt states she was bit by her dog ~20 min PTA-puncture wound to left wrist-jagged bite wound to left cheek-reports dog is UTD rabies vaccine-NAD-steady gait

## 2020-05-06 NOTE — ED Provider Notes (Signed)
[facility]    CSN: [id number] Arrival date & time: 05/06/20  1218      History   Chief Complaint Chief Complaint  Patient presents with  . Animal Bite    HPI Dana Todd is a 34 y.o. female  Presenting for animal bite to left cheek, left arm that occurred shortly PTA.  Denies anticoagulant or blood thinner use.  Believes tetanus was updated in 2016, though requesting update today.  Dog was owned by patient's father who recently passed.  Up-to-date on shots.  Past Medical History:  Diagnosis Date  . Anxiety   . Depression   . GERD (gastroesophageal reflux disease)     Patient Active Problem List   Diagnosis Date Noted  . Anxiety and depression 02/24/2017    Past Surgical History:  Procedure Laterality Date  . NO PAST SURGERIES      OB History   No obstetric history on file.      Home Medications    Prior to Admission medications   Medication Sig Start Date End Date Taking? Authorizing Provider  escitalopram (LEXAPRO) 5 MG tablet Take 5 mg by mouth daily.   Yes [provider]  amoxicillin-clavulanate (AUGMENTIN) 875-125 MG tablet Take 1 tablet by mouth every 12 (twelve) hours. 05/06/20   Hall-Potvin, [place], PA-C  cholecalciferol (VITAMIN D) 1000 units tablet Take 1,000 Units by mouth daily.    [provider]  Cyanocobalamin (VITAMIN B 12 PO) Take 1 tablet by mouth daily.    [provider]  famotidine (PEPCID) 20 MG tablet Take 1 tablet (20 mg total) by mouth daily. 04/06/18   Caccavale, Sophia, PA-C  PARAGARD INTRAUTERINE COPPER IU 1 Device by Intrauterine route once.    [provider]    Family History Family History  Problem Relation Age of Onset  . Bipolar disorder Mother   . Schizophrenia Mother   . Anxiety disorder Mother   . Anxiety disorder Brother   . Depression Brother   . Post-traumatic stress disorder Brother   . Schizophrenia Maternal Grandfather   . Suicidality Neg Hx      Social History Social History   Tobacco Use  . Smoking status: Never Smoker  . Smokeless tobacco: Never Used  Vaping Use  . Vaping Use: Never used  Substance Use Topics  . Alcohol use: Yes    Comment: occ  . Drug use: No     Allergies   Other and Pork-derived products   Review of Systems Review of Systems  Constitutional: Negative for fatigue and fever.  HENT: Negative for ear pain, sinus pain, sore throat and voice change.   Eyes: Negative for pain, redness and visual disturbance.  Respiratory: Negative for cough and shortness of breath.   Cardiovascular: Negative for chest pain and palpitations.  Gastrointestinal: Negative for abdominal pain, diarrhea and vomiting.  Musculoskeletal: Negative for arthralgias and myalgias.  Skin: Positive for wound. Negative for rash.  Neurological: Negative for syncope and headaches.     Physical Exam Triage Vital Signs ED Triage Vitals  Enc Vitals Group     BP 05/06/20 1302 133/83     Pulse Rate 05/06/20 1302 84     Resp 05/06/20 1302 18     Temp 05/06/20 1302 99.5 F (37.5 C)     Temp Source 05/06/20 1302 Oral     SpO2 05/06/20 1302 97 %     Weight 05/06/20 1257 215 lb (97.5 kg)     Height 05/06/20 1257 5' (1.524  m)     Head Circumference --      Peak Flow --      Pain Score 05/06/20 1257 9     Pain Loc --      Pain Edu? --      Excl. in GC? --    No data found.  Updated Vital Signs BP 133/83 (BP Location: Right Arm)   Pulse 84   Temp 99.5 F (37.5 C) (Oral)   Resp 18   Ht 5' (1.524 m)   Wt 215 lb (97.5 kg)   LMP 04/22/2020   SpO2 97%   BMI 41.99 kg/m   Visual Acuity Right Eye Distance:   Left Eye Distance:   Bilateral Distance:    Right Eye Near:   Left Eye Near:    Bilateral Near:     Physical Exam Constitutional:      General: She is not in acute distress. HENT:     Head: Normocephalic and atraumatic.  Eyes:     General: No scleral icterus.    Extraocular Movements: Extraocular movements  intact.     Conjunctiva/sclera: Conjunctivae normal.     Pupils: Pupils are equal, round, and reactive to light.  Cardiovascular:     Rate and Rhythm: Normal rate.  Pulmonary:     Effort: Pulmonary effort is normal.  Musculoskeletal:        General: Swelling and tenderness present.  Skin:    Coloration: Skin is not jaundiced or pale.     Comments: Jagged superficial lacerations to left cheek.  No contamination, foreign body.  No active bleeding.  Neurological:     Mental Status: She is alert and oriented to person, place, and time.      UC Treatments / Results  Labs (all labs ordered are listed, but only abnormal results are displayed) Labs Reviewed - No data to display  EKG   Radiology No results found.  Procedures Laceration Repair  Date/Time: 05/06/2020 2:50 PM Performed by: Shea Evans, PA-C Authorized by: Shea Evans, PA-C   Consent:    Consent obtained:  Verbal   Consent given by:  Patient   Risks discussed:  Infection, need for additional repair, pain, poor cosmetic result and poor wound healing   Alternatives discussed:  No treatment and delayed treatment Universal protocol:    Patient identity confirmed:  Verbally with patient Anesthesia (see MAR for exact dosages):    Anesthesia method:  None Laceration details:    Location:  Face   Face location:  L cheek   Length (cm):  1   Depth (mm):  2 Repair type:    Repair type:  Simple Pre-procedure details:    Preparation:  Patient was prepped and draped in usual sterile fashion Exploration:    Hemostasis achieved with:  Direct pressure   Wound exploration: wound explored through full range of motion     Contaminated: no   Treatment:    Area cleansed with:  Betadine   Amount of cleaning:  Standard Skin repair:    Repair method:  Steri-Strips   Number of Steri-Strips:  2 Approximation:    Approximation:  Close Post-procedure details:    Dressing:  Non-adherent dressing   Patient  tolerance of procedure:  Tolerated well, no immediate complications   (including critical care time)  Medications Ordered in UC Medications  Tdap (BOOSTRIX) injection 0.5 mL (0.5 mLs Intramuscular Given 05/06/20 1332)    Initial Impression / Assessment and Plan / UC Course  I have  reviewed the triage vital signs and the nursing notes.  Pertinent labs & imaging results that were available during my care of the patient were reviewed by me and considered in my medical decision making (see chart for details).     Lacerations are superficial, skin tear-like: Unable to use sutures.  Approximated tissue and applied to Steri-Strips.  Tetanus updated in office today.  Will start Augmentin given mechanism of injury.  Dog up-to-date on rabies vaccine.  Wound thoroughly irrigated, explored in office.  Reviewed wound care as below.  Return precautions discussed, pt verbalized understanding and is agreeable to plan. Final Clinical Impressions(s) / UC Diagnoses   Final diagnoses:  Dog bite, initial encounter  Face lacerations, initial encounter     Discharge Instructions     Keep area(s) clean and dry. Apply ice for 20 minutes 5 times daily. Take antibiotic as prescribed with food - important to complete course. Return for worsening pain, redness, swelling, discharge, fever.    ED Prescriptions    Medication Sig Dispense Auth. Provider   amoxicillin-clavulanate (AUGMENTIN) 875-125 MG tablet Take 1 tablet by mouth every 12 (twelve) hours. 14 tablet Hall-Potvin, [place], PA-C     PDMP not reviewed this encounter.   Hall-Potvin, [place], [place] 05/06/20 1452

## 2021-04-07 ENCOUNTER — Encounter (HOSPITAL_BASED_OUTPATIENT_CLINIC_OR_DEPARTMENT_OTHER): Payer: Self-pay | Admitting: Obstetrics and Gynecology

## 2021-04-07 ENCOUNTER — Other Ambulatory Visit: Payer: Self-pay

## 2021-04-07 ENCOUNTER — Other Ambulatory Visit: Payer: Self-pay | Admitting: Obstetrics and Gynecology

## 2021-04-07 DIAGNOSIS — T8339XA Other mechanical complication of intrauterine contraceptive device, initial encounter: Secondary | ICD-10-CM

## 2021-04-07 HISTORY — DX: Other mechanical complication of intrauterine contraceptive device, initial encounter: T83.39XA

## 2021-04-07 NOTE — Progress Notes (Addendum)
Spoke w/ via phone for pre-op interview---pt Lab needs dos----    urin poct per anesthesia, surgery orders pending           Lab results------none COVID test -----patient states asymptomatic no test needed Arrive at -------800 am 04-10-2021 NPO after MN NO Solid Food.  Clear liquids from MN until---700 am Med rec completed Medications to take morning of surgery -----none Diabetic medication -----n/a Patient instructed no nail polish to be worn day of surgery Patient instructed to bring photo id and insurance card day of surgery Patient aware to have Driver (ride ) / caregiver    for 24 hours after surgery spouse Dana Todd Patient Special Instructions -----none Pre-Op special Istructions -----surgery orders req dr cousins epic ib Patient verbalized understanding of instructions that were given at this phone interview. Patient denies shortness of breath, chest pain, fever, cough at this phone interview.

## 2021-04-09 NOTE — Progress Notes (Signed)
Spoke w/ via phone for pre-op interview---pt Lab needs dos---- hemaglobin and hematocrit, urine preg              Lab results------none COVID test -----patient states asymptomatic no test needed Arrive at -------900  am 04-17-2021 NPO after MN NO Solid Food.  Clear liquids from MN until---800 am Med rec completed Medications to take morning of surgery -----none Diabetic medication -----n/a Patient instructed no nail polish to be worn day of surgery Patient instructed to bring photo id and insurance card day of surgery Patient aware to have Driver (ride ) / caregiver    for 24 hours after surgery spouse Dana Todd Patient Special Instructions -----none Pre-Op special Istructions -----none Patient verbalized understanding of instructions that were given at this phone interview. Patient denies shortness of breath, chest pain, fever, cough at this phone interview.

## 2021-04-17 ENCOUNTER — Encounter (HOSPITAL_BASED_OUTPATIENT_CLINIC_OR_DEPARTMENT_OTHER)
Admission: RE | Disposition: A | Discharge status: Home / Self Care | Payer: Self-pay | Source: Home / Self Care | Attending: Obstetrics and Gynecology

## 2021-04-17 ENCOUNTER — Other Ambulatory Visit: Payer: Self-pay

## 2021-04-17 ENCOUNTER — Encounter (HOSPITAL_BASED_OUTPATIENT_CLINIC_OR_DEPARTMENT_OTHER): Payer: Self-pay | Admitting: Obstetrics and Gynecology

## 2021-04-17 ENCOUNTER — Ambulatory Visit (HOSPITAL_BASED_OUTPATIENT_CLINIC_OR_DEPARTMENT_OTHER): Payer: 59 | Admitting: Anesthesiology

## 2021-04-17 ENCOUNTER — Ambulatory Visit (HOSPITAL_BASED_OUTPATIENT_CLINIC_OR_DEPARTMENT_OTHER)
Admission: RE | Admit: 2021-04-17 | Discharge: 2021-04-17 | Disposition: A | Discharge status: Home / Self Care | Payer: 59 | Attending: Obstetrics and Gynecology | Admitting: Obstetrics and Gynecology

## 2021-04-17 DIAGNOSIS — Z6841 Body Mass Index (BMI) 40.0 and over, adult: Secondary | ICD-10-CM | POA: Diagnosis not present

## 2021-04-17 DIAGNOSIS — Z975 Presence of (intrauterine) contraceptive device: Secondary | ICD-10-CM | POA: Diagnosis not present

## 2021-04-17 DIAGNOSIS — Z302 Encounter for sterilization: Secondary | ICD-10-CM | POA: Insufficient documentation

## 2021-04-17 HISTORY — PX: HYSTEROSCOPY WITH RESECTOSCOPE: SHX5395

## 2021-04-17 HISTORY — PX: TUBAL LIGATION: SHX77

## 2021-04-17 HISTORY — DX: Presence of dental prosthetic device (complete) (partial): Z97.2

## 2021-04-17 LAB — POCT I-STAT, CHEM 8
BUN: 11 mg/dL (ref 6–20)
Calcium, Ion: 1 mmol/L — ABNORMAL LOW (ref 1.15–1.40)
Chloride: 111 mmol/L (ref 98–111)
Creatinine, Ser: 0.7 mg/dL (ref 0.44–1.00)
Glucose, Bld: 98 mg/dL (ref 70–99)
HCT: 37 % (ref 36.0–46.0)
Hemoglobin: 12.6 g/dL (ref 12.0–15.0)
Potassium: 5.4 mmol/L — ABNORMAL HIGH (ref 3.5–5.1)
Sodium: 139 mmol/L (ref 135–145)
TCO2: 21 mmol/L — ABNORMAL LOW (ref 22–32)

## 2021-04-17 LAB — POCT PREGNANCY, URINE: Preg Test, Ur: NEGATIVE

## 2021-04-17 SURGERY — HYSTEROSCOPY, USING RESECTOSCOPE
Anesthesia: General | Site: Uterus

## 2021-04-17 MED ORDER — BUPIVACAINE HCL (PF) 0.25 % IJ SOLN
INTRAMUSCULAR | Status: DC | PRN
  Administered 2021-04-17: 10 mL

## 2021-04-17 MED ORDER — OXYCODONE HCL 5 MG PO TABS
ORAL_TABLET | ORAL | Status: AC
  Filled 2021-04-17: qty 1

## 2021-04-17 MED ORDER — KETOROLAC TROMETHAMINE 30 MG/ML IJ SOLN: 30.0000 mg | Freq: Once | INTRAMUSCULAR | Status: DC | PRN

## 2021-04-17 MED ORDER — KETOROLAC TROMETHAMINE 30 MG/ML IJ SOLN
INTRAMUSCULAR | Status: DC | PRN
  Administered 2021-04-17: 30 mg via INTRAVENOUS

## 2021-04-17 MED ORDER — ACETAMINOPHEN 10 MG/ML IV SOLN
INTRAVENOUS | Status: AC
  Filled 2021-04-17: qty 100

## 2021-04-17 MED ORDER — FENTANYL CITRATE (PF) 100 MCG/2ML IJ SOLN: 25.0000 ug | INTRAMUSCULAR | Status: DC | PRN

## 2021-04-17 MED ORDER — IBUPROFEN 800 MG PO TABS: 800.0000 mg | ORAL_TABLET | Freq: Three times a day (TID) | ORAL | 11 refills | Status: AC | PRN

## 2021-04-17 MED ORDER — LIDOCAINE 2% (20 MG/ML) 5 ML SYRINGE
INTRAMUSCULAR | Status: AC
  Filled 2021-04-17: qty 5

## 2021-04-17 MED ORDER — ONDANSETRON HCL 4 MG/2ML IJ SOLN
INTRAMUSCULAR | Status: DC | PRN
  Administered 2021-04-17: 4 mg via INTRAVENOUS

## 2021-04-17 MED ORDER — DEXAMETHASONE SODIUM PHOSPHATE 10 MG/ML IJ SOLN
INTRAMUSCULAR | Status: AC
  Filled 2021-04-17: qty 1

## 2021-04-17 MED ORDER — PROPOFOL 10 MG/ML IV BOLUS
INTRAVENOUS | Status: AC
  Filled 2021-04-17: qty 40

## 2021-04-17 MED ORDER — KETOROLAC TROMETHAMINE 30 MG/ML IJ SOLN
INTRAMUSCULAR | Status: AC
  Filled 2021-04-17: qty 1

## 2021-04-17 MED ORDER — ACETAMINOPHEN 10 MG/ML IV SOLN
1000.0000 mg | Freq: Once | INTRAVENOUS | Status: AC
  Administered 2021-04-17: 1000 mg via INTRAVENOUS

## 2021-04-17 MED ORDER — FENTANYL CITRATE (PF) 100 MCG/2ML IJ SOLN
INTRAMUSCULAR | Status: AC
  Filled 2021-04-17: qty 2

## 2021-04-17 MED ORDER — MIDAZOLAM HCL 2 MG/2ML IJ SOLN
INTRAMUSCULAR | Status: AC
  Filled 2021-04-17: qty 2

## 2021-04-17 MED ORDER — ROCURONIUM BROMIDE 10 MG/ML (PF) SYRINGE
PREFILLED_SYRINGE | INTRAVENOUS | Status: DC | PRN
  Administered 2021-04-17: 70 mg via INTRAVENOUS

## 2021-04-17 MED ORDER — FENTANYL CITRATE (PF) 100 MCG/2ML IJ SOLN
INTRAMUSCULAR | Status: DC | PRN
  Administered 2021-04-17 (×2): 50 ug via INTRAVENOUS

## 2021-04-17 MED ORDER — MIDAZOLAM HCL 5 MG/5ML IJ SOLN
INTRAMUSCULAR | Status: DC | PRN
  Administered 2021-04-17: 2 mg via INTRAVENOUS

## 2021-04-17 MED ORDER — LIDOCAINE 2% (20 MG/ML) 5 ML SYRINGE
INTRAMUSCULAR | Status: DC | PRN
  Administered 2021-04-17: 100 mg via INTRAVENOUS

## 2021-04-17 MED ORDER — SUGAMMADEX SODIUM 200 MG/2ML IV SOLN
INTRAVENOUS | Status: DC | PRN
  Administered 2021-04-17: 240 mg via INTRAVENOUS

## 2021-04-17 MED ORDER — LACTATED RINGERS IV SOLN: INTRAVENOUS | Status: DC

## 2021-04-17 MED ORDER — OXYCODONE-ACETAMINOPHEN 5-325 MG PO TABS: 1.0000 | ORAL_TABLET | Freq: Four times a day (QID) | ORAL | 0 refills | Status: AC | PRN

## 2021-04-17 MED ORDER — ROCURONIUM BROMIDE 10 MG/ML (PF) SYRINGE
PREFILLED_SYRINGE | INTRAVENOUS | Status: AC
  Filled 2021-04-17: qty 10

## 2021-04-17 MED ORDER — PROPOFOL 10 MG/ML IV BOLUS
INTRAVENOUS | Status: DC | PRN
  Administered 2021-04-17: 200 mg via INTRAVENOUS

## 2021-04-17 MED ORDER — OXYCODONE HCL 5 MG PO TABS
5.0000 mg | ORAL_TABLET | Freq: Once | ORAL | Status: AC | PRN
  Administered 2021-04-17: 5 mg via ORAL

## 2021-04-17 MED ORDER — DEXAMETHASONE SODIUM PHOSPHATE 10 MG/ML IJ SOLN
INTRAMUSCULAR | Status: DC | PRN
  Administered 2021-04-17: 10 mg via INTRAVENOUS

## 2021-04-17 MED ORDER — POVIDONE-IODINE 10 % EX SWAB: 2.0000 "application " | Freq: Once | CUTANEOUS | Status: DC

## 2021-04-17 MED ORDER — ONDANSETRON HCL 4 MG/2ML IJ SOLN
INTRAMUSCULAR | Status: AC
  Filled 2021-04-17: qty 2

## 2021-04-17 MED ORDER — PROMETHAZINE HCL 25 MG/ML IJ SOLN: 6.2500 mg | INTRAMUSCULAR | Status: DC | PRN

## 2021-04-17 MED ORDER — SODIUM CHLORIDE 0.9 % IR SOLN
Status: DC | PRN
  Administered 2021-04-17: 1000 mL
  Administered 2021-04-17: 3000 mL

## 2021-04-17 MED ORDER — OXYCODONE HCL 5 MG/5ML PO SOLN: 5.0000 mg | Freq: Once | ORAL | Status: AC | PRN

## 2021-04-17 SURGICAL SUPPLY — 35 items
ADH SKN CLS APL DERMABOND .7 (GAUZE/BANDAGES/DRESSINGS) ×3
BIPOLAR CUTTING LOOP 21FR (ELECTRODE)
CATH ROBINSON RED A/P 16FR (CATHETERS) ×4 IMPLANT
DERMABOND ADVANCED (GAUZE/BANDAGES/DRESSINGS) ×1
DERMABOND ADVANCED .7 DNX12 (GAUZE/BANDAGES/DRESSINGS) ×3 IMPLANT
DEVICE MYOSURE LITE (MISCELLANEOUS) ×4 IMPLANT
DEVICE MYOSURE REACH (MISCELLANEOUS) IMPLANT
DILATOR CANAL MILEX (MISCELLANEOUS) IMPLANT
DURAPREP 26ML APPLICATOR (WOUND CARE) ×4 IMPLANT
GAUZE 4X4 16PLY ~~LOC~~+RFID DBL (SPONGE) ×4 IMPLANT
GLOVE SURG LTX SZ6.5 (GLOVE) ×4 IMPLANT
GLOVE SURG UNDER POLY LF SZ7 (GLOVE) ×12 IMPLANT
GOWN STRL REUS W/TWL LRG LVL3 (GOWN DISPOSABLE) ×8 IMPLANT
KIT PROCEDURE FLUENT (KITS) ×4 IMPLANT
KIT TURNOVER CYSTO (KITS) ×4 IMPLANT
LOOP CUTTING BIPOLAR 21FR (ELECTRODE) IMPLANT
MYOSURE XL FIBROID (MISCELLANEOUS)
NEEDLE INSUFFLATION 120MM (ENDOMECHANICALS) ×4 IMPLANT
PACK LAPAROSCOPY BASIN (CUSTOM PROCEDURE TRAY) ×4 IMPLANT
PACK TRENDGUARD 450 HYBRID PRO (MISCELLANEOUS) ×3 IMPLANT
PACK VAGINAL MINOR WOMEN LF (CUSTOM PROCEDURE TRAY) ×4 IMPLANT
PAD OB MATERNITY 4.3X12.25 (PERSONAL CARE ITEMS) ×4 IMPLANT
PAD PREP 24X48 CUFFED NSTRL (MISCELLANEOUS) ×4 IMPLANT
PROTECTOR NERVE ULNAR (MISCELLANEOUS) IMPLANT
SEAL ROD LENS SCOPE MYOSURE (ABLATOR) ×4 IMPLANT
SET TUBE SMOKE EVAC HIGH FLOW (TUBING) ×4 IMPLANT
SUT VIC AB 4-0 PS2 18 (SUTURE) ×4 IMPLANT
SUT VICRYL 0 UR6 27IN ABS (SUTURE) ×4 IMPLANT
SYSTEM TISS REMOVAL MYOSURE XL (MISCELLANEOUS) IMPLANT
TOWEL OR 17X26 10 PK STRL BLUE (TOWEL DISPOSABLE) ×4 IMPLANT
TRENDGUARD 450 HYBRID PRO PACK (MISCELLANEOUS) ×4
TROCAR OPTI TIP 5M 100M (ENDOMECHANICALS) ×4 IMPLANT
TROCAR XCEL DIL TIP R 11M (ENDOMECHANICALS) ×4 IMPLANT
WARMER LAPAROSCOPE (MISCELLANEOUS) ×4 IMPLANT
WATER STERILE IRR 500ML POUR (IV SOLUTION) ×4 IMPLANT

## 2021-04-17 NOTE — Discharge Instructions (Addendum)
CALL  IF TEMP>100.4, NOTHING PER VAGINA X 2 WK, CALL IF SOAKING A MAXI  PAD EVERY HOUR OR MORE FREQUENTLY. Warm compress to abdomen every 4 hrs x 24 hrs   Post Anesthesia Home Care Instructions  Activity: Get plenty of rest for the remainder of the day. A responsible individual must stay with you for 24 hours following the procedure.  For the next 24 hours, DO NOT: -Drive a car -Advertising copywriter -Drink alcoholic beverages -Take any medication unless instructed by your physician -Make any legal decisions or sign important papers.  Meals: Start with liquid foods such as gelatin or soup. Progress to regular foods as tolerated. Avoid greasy, spicy, heavy foods. If nausea and/or vomiting occur, drink only clear liquids until the nausea and/or vomiting subsides. Call your physician if vomiting continues.  Special Instructions/Symptoms: Your throat may feel dry or sore from the anesthesia or the breathing tube placed in your throat during surgery. If this causes discomfort, gargle with warm salt water. The discomfort should disappear within 24 hours.  DISCHARGE INSTRUCTIONS: HYSTEROSCOPY / ENDOMETRIAL ABLATION The following instructions have been prepared to help you care for yourself upon your return home.  May take Ibuprofen after 6 pm today.  May take Tylenol after 7:45 pm today.  May take stool softner while taking narcotic pain medication to prevent constipation.  Drink plenty of water.  Personal hygiene:  Use sanitary pads for vaginal drainage, not tampons.  Shower the day after your procedure.  NO tub baths, pools or Jacuzzis for 2-3 weeks.  Wipe front to back after using the bathroom.  Activity and limitations:  Do NOT drive or operate any equipment for 24 hours. The effects of anesthesia are still present and drowsiness may result.  Do NOT rest in bed all day.  Walking is encouraged.  Walk up and down stairs slowly.  You may resume your normal activity in one to two days  or as indicated by your physician. Sexual activity: NO intercourse for at least 2 weeks after the procedure, or as indicated by your Doctor.  Diet: Eat a light meal as desired this evening. You may resume your usual diet tomorrow.  Return to Work: You may resume your work activities in one to two days or as indicated by Therapist, sports.  What to expect after your surgery: Expect to have vaginal bleeding/discharge for 2-3 days and spotting for up to 10 days. It is not unusual to have soreness for up to 1-2 weeks. You may have a slight burning sensation when you urinate for the first day. Mild cramps may continue for a couple of days. You may have a regular period in 2-6 weeks.  Call your doctor for any of the following:  Excessive vaginal bleeding or clotting, saturating and changing one pad every hour.  Inability to urinate 6 hours after discharge from hospital.  Pain not relieved by pain medication.  Fever of 100.4 F or greater.  Unusual vaginal discharge or odor.   DISCHARGE INSTRUCTIONS: Laparoscopy  The following instructions have been prepared to help you care for yourself upon your return home today.  Wound care:  Do not get the incision wet for the first 24 hours. The incision should be kept clean and dry.  The Band-Aids or dressings may be removed the day after surgery.  Should the incision become sore, red, and swollen after the first week, check with your doctor.  Personal hygiene:  Shower the day after your procedure.  Activity and limitations:  Do NOT drive or operate any equipment today.  Do NOT lift anything more than 15 pounds for 2-3 weeks after surgery.  Do NOT rest in bed all day.  Walking is encouraged. Walk each day, starting slowly with 5-minute walks 3 or 4 times a day. Slowly increase the length of your walks.  Walk up and down stairs slowly.  Do NOT do strenuous activities, such as golfing, playing tennis, bowling, running, biking, weight lifting,  gardening, mowing, or vacuuming for 2-4 weeks. Ask your doctor when it is okay to start.  Diet: Eat a light meal as desired this evening. You may resume your usual diet tomorrow.  Return to work: This is dependent on the type of work you do. For the most part you can return to a desk job within a week of surgery. If you are more active at work, please discuss this with your doctor.  What to expect after your surgery: You may have a slight burning sensation when you urinate on the first day. You may have a very small amount of blood in the urine. Expect to have a small amount of vaginal discharge/light bleeding for 1-2 weeks. It is not unusual to have abdominal soreness and bruising for up to 2 weeks. You may be tired and need more rest for about 1 week. You may experience shoulder pain for 24-72 hours. Lying flat in bed may relieve it.  Call your doctor for any of the following:  Develop a fever of 100.4 or greater  Inability to urinate 6 hours after discharge from hospital  Severe pain not relieved by pain medications  Persistent of heavy bleeding at incision site  Redness or swelling around incision site after a week  Increasing nausea or vomiting

## 2021-04-17 NOTE — Anesthesia Procedure Notes (Signed)
Procedure Name: Intubation Date/Time: 04/17/2021 1:36 PM Performed by: Bonney Aid, CRNA Pre-anesthesia Checklist: Patient identified, Emergency Drugs available, Suction available and Patient being monitored Patient Re-evaluated:Patient Re-evaluated prior to induction Oxygen Delivery Method: Circle system utilized Preoxygenation: Pre-oxygenation with 100% oxygen Induction Type: IV induction Ventilation: Mask ventilation without difficulty Laryngoscope Size: Mac and 3 Grade View: Grade I Tube type: Oral Tube size: 7.0 mm Number of attempts: 1 Airway Equipment and Method: Stylet Placement Confirmation: ETT inserted through vocal cords under direct vision, positive ETCO2 and breath sounds checked- equal and bilateral Secured at: 20 cm Tube secured with: Tape Dental Injury: Teeth and Oropharynx as per pre-operative assessment

## 2021-04-17 NOTE — Anesthesia Preprocedure Evaluation (Signed)
Anesthesia Evaluation  Patient identified by MRN, date of birth, ID band Patient awake    Reviewed: Allergy & Precautions, NPO status , Patient's Chart, lab work & pertinent test results  Airway Mallampati: II  TM Distance: >3 FB Neck ROM: Full    Dental  (+) Upper Dentures   Pulmonary neg pulmonary ROS,    Pulmonary exam normal breath sounds clear to auscultation       Cardiovascular negative cardio ROS Normal cardiovascular exam Rhythm:Regular Rate:Normal     Neuro/Psych negative neurological ROS  negative psych ROS   GI/Hepatic Neg liver ROS, GERD  Medicated,  Endo/Other  Morbid obesity  Renal/GU negative Renal ROS  negative genitourinary   Musculoskeletal negative musculoskeletal ROS (+)   Abdominal   Peds negative pediatric ROS (+)  Hematology negative hematology ROS (+)   Anesthesia Other Findings   Reproductive/Obstetrics negative OB ROS                            Anesthesia Physical Anesthesia Plan  ASA: 2  Anesthesia Plan: General   Post-op Pain Management:    Induction: Intravenous  PONV Risk Score and Plan: 3 and Ondansetron, Dexamethasone, Midazolam and Treatment may vary due to age or medical condition  Airway Management Planned: Oral ETT  Additional Equipment:   Intra-op Plan:   Post-operative Plan: Extubation in OR  Informed Consent: I have reviewed the patients History and Physical, chart, labs and discussed the procedure including the risks, benefits and alternatives for the proposed anesthesia with the patient or authorized representative who has indicated his/her understanding and acceptance.     Dental advisory given  Plan Discussed with: CRNA and Surgeon  Anesthesia Plan Comments:         Anesthesia Quick Evaluation

## 2021-04-17 NOTE — Op Note (Signed)
Dana Todd, Dana Todd MEDICAL RECORD NO: [id number] ACCOUNT NO: [id number] DATE OF BIRTH: 1986/01/27 FACILITY: [facility] LOCATION: [facility] PHYSICIAN: Aviyon Hocevar A. Cherly Hensen, MD  Operative Report   DATE OF PROCEDURE: 04/17/2021  PREOPERATIVE DIAGNOSES: 1.  Desires sterilization. 2.  Retained IUD.  PROCEDURE:  Diagnostic hysteroscopy, hysteroscopic resection of endometrial thickening, D and C, laparoscopic tubal ligation with bipolar cautery.  POSTOPERATIVE DIAGNOSIS:  Desires sterilization.  ANESTHESIA:  General.  SURGEON:  Deshayla Empson A. Cherly Hensen, MD  ASSISTANT:  None.  DESCRIPTION OF PROCEDURE:  Under adequate general anesthesia, the patient was placed in the dorsal lithotomy position.  She was sterilely prepped and draped in usual fashion.  Indwelling Foley catheter was sterilely placed.  Examination under anesthesia  revealed anteverted uterus, but limited by the patient's body habitus.  No adnexal masses could be appreciated.  Bivalve speculum was placed in the vagina.  A single-tooth tenaculum was placed on the anterior lip of the cervix.  The cervix was dilated to  #19 [facility] dilator.  A diagnostic hysteroscope was then inserted into the uterine cavity. No evidence of a remnant of IUD was noted.  The posterior aspect of the endometrial cavity was thickened and there was focal thickening anteriorly. Despite  panoramic inspection, there was no evidence of a remnant. Due to the thickness posteriorly and the possibility that an IUD remnant could be within the thickness and concern that regular curettage of the cavity could make finding of the IUD remnant more difficult to find, opted for the LITE resectoscope which was then inserted and the endometrial thickening was resected and again no IUD  remnant was seen.  The endocervical canal at that point was also inspected, no IUD remnants noted.  At that point, the procedure was terminated by removing the hysteroscope. The Acorn cannula was then  introduced into the cervix and attached to the  tenaculum for manipulation of the uterus and the bivalve speculum was removed.  Attention was then turned to the abdomen.  Infraumbilical small incision was made after 0.25% Marcaine was injected.  The small vertical umbilical incision was then made, a Veress needle was  introduced, tested with normal saline and opening pressure of 4 noted and 3 liters of CO2 was insufflated.  The Veress needle was then removed.  A 10 mm disposable trocar was introduced into the abdomen without incident.  A lighted video laparoscope was then  inserted.  Panoramic inspection showed entry into the abdomen without incident.  Fluid within the pelvis from the hysteroscopy procedure, otherwise unremarkable.  Small subserosal fibroids were noted on the uterus. The anterior and posterior aspect of the uterus  was inspected for any perforation of any remnants of the IUD, none was seen.  Both ovaries were normal.  Normal elongated fallopian  tubes were noted.  No evidence of endometriosis was noted in the  anterior or posterior cul-de-sac.  A suprapubic incision was made and under direct visualization, a 5 mm trocar was placed.  A probe was then used to further inspect the pelvis and the uterus, again nothing was seen other than what was previously described.  Using the Kleppinger, the mid portion of the tubes who were identified to their fimbriated ends bilaterally were cauterized. When it was felt to adequately be cauterized bilaterally, the procedure was terminated.  The lower port was removed under direct visualization.  The abdomen was desufflated.   Infraumbilical port was removed under direct visualization, taking care not to bring up any underlying structures and the abdomen was deflated  and all instruments were then removed.  The incisions were closed with 4-0 Vicryl subcuticular closure.  The instruments in the vagina was removed.  Specimen was endometrial curetting, sent to  pathology.  Estimated blood loss was minimal.  Complication was none.  The patient tolerated the procedure well and was transferred to recovery in stable condition.   VAI D: 04/17/2021 2:13:39 pm T: 04/17/2021 11:20:00 pm  JOB: [id number]/ [id number]

## 2021-04-17 NOTE — Brief Op Note (Signed)
04/17/2021  1:55 PM  PATIENT:  Dana Todd  35 y.o. female  PRE-OPERATIVE DIAGNOSIS:  retained IUD remnant, desires sterilization  POST-OPERATIVE DIAGNOSIS:  desires sterilization, endometrial thickening  PROCEDURE:  diagnostic hysteroscopy, hysteroscopic resection using myosure, D&C, laparoscopic tubal ligation with bipolar cautery  SURGEON:  Surgeon(s) and Role:    * Maxie Better, MD - Primary  PHYSICIAN ASSISTANT:   ASSISTANTS: none   ANESTHESIA:   general FINDINGS: No IUD remnant. Thickened endometrium. Small SS fibroids, nl tubes and ovaries. No evidence of uterine perforation. Nl liver edge EBL:  20 mL   BLOOD ADMINISTERED:none  DRAINS: none   LOCAL MEDICATIONS USED:  MARCAINE     SPECIMEN:  Source of Specimen:  emc  DISPOSITION OF SPECIMEN:  PATHOLOGY  COUNTS:  YES  TOURNIQUET:  * No tourniquets in log *  DICTATION: .Other Dictation: Dictation Number [id number]  PLAN OF CARE: Discharge to home after PACU  PATIENT DISPOSITION:  PACU - hemodynamically stable.   Delay start of Pharmacological VTE agent (>24hrs) due to surgical blood loss or risk of bleeding: no

## 2021-04-17 NOTE — Transfer of Care (Signed)
Immediate Anesthesia Transfer of Care Note  Patient: Dana Todd  Procedure(s) Performed: HYSTEROSCOPY (Uterus) BILATERAL TUBAL LIGATION (Bilateral: Abdomen)  Patient Location: PACU  Anesthesia Type:General  Level of Consciousness: awake, alert  and oriented  Airway & Oxygen Therapy: Patient Spontanous Breathing and Patient connected to nasal cannula oxygen  Post-op Assessment: Report given to RN  Post vital signs: Reviewed and stable  Last Vitals:  Vitals Value Taken Time  BP 141/76 04/17/21 1325  Temp    Pulse 92   Resp 12 04/17/21 1328  SpO2 100%   Vitals shown include unvalidated device data.  Last Pain:  Vitals:   04/17/21 0802  TempSrc:   PainSc: 4       Patients Stated Pain Goal: 4 (04/17/21 0802)  Complications: No notable events documented.

## 2021-04-17 NOTE — Interval H&P Note (Signed)
History and Physical Interval Note:  04/17/2021 11:20 AM  Dana Todd  has presented today for surgery, with the diagnosis of retained IUD.  The various methods of treatment have been discussed with the patient and family. After consideration of risks, benefits and other options for treatment, the patient has consented to  DIAGNOSTIC HYSTEROSCOPY, HYSTEROSCOPIC REMOVAL OF IUD REMNANT, LAPAROSCOPIC TUBAL LIGATION WITH BIPOLAR CAUTERY as a surgical intervention.  The patient's history has been reviewed, patient examined, no change in status, stable for surgery.  I have reviewed the patient's chart and labs.  Questions were answered to the patient's satisfaction.     Dana Todd A Amalea Ottey

## 2021-04-17 NOTE — H&P (Signed)
Dana Todd is an 35 y.o. female. G0 SBF presents for LTL with cautery, hysteroscopic removal of IUD remnant. Pt does not desire future fertility. Declines alternative birth control. Pt had a Paragard IUD which when removed in the office part of the arm was noted to be missing. Attempt removal with sonogram was not successful.  Pertinent Gynecological History: Menses: IUD Bleeding: n/a Contraception: none DES exposure: denies Blood transfusions: none Sexually transmitted diseases: no past history Previous GYN Procedures:  none   Last mammogram:  n/a  Date: n/a Last pap: normal Date: 2022 OB History: G0, P0   Menstrual History: Menarche age: n/a Patient's last menstrual period was 03/27/2021.    Past Medical History:  Diagnosis Date   Anxiety    Depression    GERD (gastroesophageal reflux disease)    Retained intrauterine contraceptive device (IUD) 04/07/2021   Wears partial dentures    upper    Past Surgical History:  Procedure Laterality Date   NO PAST SURGERIES      Family History  Problem Relation Age of Onset   Bipolar disorder Mother    Schizophrenia Mother    Anxiety disorder Mother    Anxiety disorder Brother    Depression Brother    Post-traumatic stress disorder Brother    Schizophrenia Maternal Grandfather    Suicidality Neg Hx     Social History:  reports that she has never smoked. She has never used smokeless tobacco. She reports current alcohol use. She reports that she does not use drugs.  Allergies:  Allergies  Allergen Reactions   Other     Gelatin stomach pain n/v   Pork-Derived Products     Stomach pain n/v    No medications prior to admission.    Review of Systems  All other systems reviewed and are negative.  Height 5' (1.524 m), weight 99.8 kg, last menstrual period 03/27/2021. Physical Exam Constitutional:      Appearance: She is obese.  Eyes:     Extraocular Movements: Extraocular movements intact.  Cardiovascular:      Rate and Rhythm: Regular rhythm.     Heart sounds: Normal heart sounds.  Pulmonary:     Breath sounds: Normal breath sounds.  Abdominal:     Palpations: Abdomen is soft.  Genitourinary:    General: Normal vulva.     Comments: Vagina nl Cervix nulliparous Uterus AV Adnexa nonpalp Musculoskeletal:     Cervical back: Neck supple.  Skin:    General: Skin is warm and dry.  Neurological:     General: No focal deficit present.     Mental Status: She is alert and oriented to person, place, and time.  Psychiatric:        Mood and Affect: Mood normal.        Behavior: Behavior normal.    No results found for this or any previous visit (from the past 24 hour(s)).  No results found.  Assessment/Plan: Desires sterilization IUD remnant P) LTL with  bipolar cautery, dx hysteroscopy, removal of IUD remnant via hysteroscopy. Procedures explained. Risk of surgery reviewed including infection, bleeding, injury to underlying organ structures, permanent sterilization, non reversible, failure rate 1/500 1/600, thermal injury, uterine perforation( 06/998) , injury to surrounding organ structures. All ? answered  Dana Todd A Dana Todd 04/17/2021, 6:48 AM

## 2021-04-20 ENCOUNTER — Encounter (HOSPITAL_BASED_OUTPATIENT_CLINIC_OR_DEPARTMENT_OTHER): Payer: Self-pay | Admitting: Obstetrics and Gynecology

## 2021-04-20 LAB — SURGICAL PATHOLOGY

## 2021-04-20 NOTE — Anesthesia Postprocedure Evaluation (Signed)
Anesthesia Post Note  Patient: Dana Todd  Procedure(s) Performed: HYSTEROSCOPY (Uterus) BILATERAL TUBAL LIGATION (Bilateral: Abdomen)     Patient location during evaluation: PACU Anesthesia Type: General Level of consciousness: awake and alert Pain management: pain level controlled Vital Signs Assessment: post-procedure vital signs reviewed and stable Respiratory status: spontaneous breathing, nonlabored ventilation, respiratory function stable and patient connected to nasal cannula oxygen Cardiovascular status: blood pressure returned to baseline and stable Postop Assessment: no apparent nausea or vomiting Anesthetic complications: no   No notable events documented.  Last Vitals:  Vitals:   04/17/21 1400 04/17/21 1430  BP: 122/81 135/83  Pulse: 90 87  Resp: 12   Temp:  37.1 C  SpO2: 99% 99%    Last Pain:  Vitals:   04/17/21 1430  TempSrc:   PainSc: 3                  Shelonda Saxe S

## 2021-05-08 ENCOUNTER — Emergency Department (HOSPITAL_COMMUNITY)
Admission: EM | Admit: 2021-05-08 | Discharge: 2021-05-08 | Disposition: A | Discharge status: Home / Self Care | Payer: 59 | Attending: Emergency Medicine | Admitting: Emergency Medicine

## 2021-05-08 ENCOUNTER — Other Ambulatory Visit: Payer: Self-pay

## 2021-05-08 ENCOUNTER — Encounter (HOSPITAL_COMMUNITY): Payer: Self-pay | Admitting: Emergency Medicine

## 2021-05-08 ENCOUNTER — Emergency Department (HOSPITAL_COMMUNITY): Payer: 59

## 2021-05-08 DIAGNOSIS — R002 Palpitations: Secondary | ICD-10-CM | POA: Insufficient documentation

## 2021-05-08 DIAGNOSIS — R0600 Dyspnea, unspecified: Secondary | ICD-10-CM

## 2021-05-08 DIAGNOSIS — R0602 Shortness of breath: Secondary | ICD-10-CM | POA: Insufficient documentation

## 2021-05-08 LAB — I-STAT CHEM 8, ED
BUN: <3 mg/dL — ABNORMAL LOW (ref 6–20)
Calcium, Ion: 1.22 mmol/L (ref 1.15–1.40)
Chloride: 104 mmol/L (ref 98–111)
Creatinine, Ser: 0.8 mg/dL (ref 0.44–1.00)
Glucose, Bld: 113 mg/dL — ABNORMAL HIGH (ref 70–99)
HCT: 39 % (ref 36.0–46.0)
Hemoglobin: 13.3 g/dL (ref 12.0–15.0)
Potassium: 3.6 mmol/L (ref 3.5–5.1)
Sodium: 142 mmol/L (ref 135–145)
TCO2: 27 mmol/L (ref 22–32)

## 2021-05-08 LAB — BASIC METABOLIC PANEL
Anion gap: 9 (ref 5–15)
BUN: <5 mg/dL — ABNORMAL LOW (ref 6–20)
CO2: 27 mmol/L (ref 22–32)
Calcium: 9.5 mg/dL (ref 8.9–10.3)
Chloride: 104 mmol/L (ref 98–111)
Creatinine, Ser: 0.84 mg/dL (ref 0.44–1.00)
GFR, Estimated: >60 mL/min (ref >60)
Glucose, Bld: 118 mg/dL — ABNORMAL HIGH (ref 70–99)
Potassium: 3.6 mmol/L (ref 3.5–5.1)
Sodium: 140 mmol/L (ref 135–145)

## 2021-05-08 LAB — CBC WITH DIFFERENTIAL/PLATELET
Abs Immature Granulocytes: 0.03 10*3/uL (ref 0.00–0.07)
Basophils Absolute: 0 10*3/uL (ref 0.0–0.1)
Basophils Relative: 0 %
Eosinophils Absolute: 0.2 10*3/uL (ref 0.0–0.5)
Eosinophils Relative: 2 %
HCT: 37.9 % (ref 36.0–46.0)
Hemoglobin: 12.3 g/dL (ref 12.0–15.0)
Immature Granulocytes: 0 %
Lymphocytes Relative: 30 %
Lymphs Abs: 3.2 10*3/uL (ref 0.7–4.0)
MCH: 28.7 pg (ref 26.0–34.0)
MCHC: 32.5 g/dL (ref 30.0–36.0)
MCV: 88.3 fL (ref 80.0–100.0)
Monocytes Absolute: 0.7 10*3/uL (ref 0.1–1.0)
Monocytes Relative: 6 %
Neutro Abs: 6.4 10*3/uL (ref 1.7–7.7)
Neutrophils Relative %: 62 %
Platelets: 338 10*3/uL (ref 150–400)
RBC: 4.29 MIL/uL (ref 3.87–5.11)
RDW: 15.5 % (ref 11.5–15.5)
WBC: 10.5 10*3/uL (ref 4.0–10.5)
nRBC: 0 % (ref 0.0–0.2)

## 2021-05-08 LAB — TROPONIN I (HIGH SENSITIVITY)
Troponin I (High Sensitivity): 3 ng/L (ref <18)
Troponin I (High Sensitivity): 3 ng/L (ref <18)

## 2021-05-08 LAB — TSH: TSH: 1.21 u[IU]/mL (ref 0.350–4.500)

## 2021-05-08 LAB — BRAIN NATRIURETIC PEPTIDE: B Natriuretic Peptide: 9.4 pg/mL (ref 0.0–100.0)

## 2021-05-08 MED ORDER — SODIUM CHLORIDE 0.9 % IV BOLUS
1000.0000 mL | Freq: Once | INTRAVENOUS | Status: AC
  Administered 2021-05-08: 1000 mL via INTRAVENOUS

## 2021-05-08 MED ORDER — IOHEXOL 350 MG/ML SOLN
100.0000 mL | Freq: Once | INTRAVENOUS | Status: AC | PRN
  Administered 2021-05-08: 100 mL via INTRAVENOUS

## 2021-05-08 MED ORDER — HYDROXYZINE HCL 25 MG PO TABS: 25.0000 mg | ORAL_TABLET | Freq: Three times a day (TID) | ORAL | 0 refills | Status: AC | PRN

## 2021-05-08 NOTE — ED Triage Notes (Signed)
Patient here for evaluation of shortness of breath and palpitations since her tubal ligation on April 17, 2021. HR in triage 144, SpO2 100% on room air. Alert, oriented, speaking in complete sentences.
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
## 2021-05-08 NOTE — ED Notes (Signed)
Ambulates in hall no assist. SpO2 remains >99%. No complaints.

## 2021-05-08 NOTE — ED Notes (Signed)
Pt verbalized understanding of d/c instructions, meds, and followup care. Denies questions. VSS, no distress noted. Steady gait to exit with all belongings.  ?

## 2021-05-08 NOTE — ED Provider Notes (Signed)
[facility] EMERGENCY DEPARTMENT Provider Note   CSN: [id number] Arrival date & time: 05/08/21  [id number]     History Chief Complaint  Patient presents with   Shortness of Breath    Dana Todd is a 35 y.o. female.  35 year old female presents today for evaluation of palpitations, shortness of breath of 2-week duration.  Patient reports she had tubal ligation done November 11th and since 2 days after surgery she has been having multiple episodes similar to this every day.  She reports these episode are short lasting but they also exacerbate her anxiety causing worsening of her symptoms.  She denies fever, chills, dysuria, chest pain, headache, change in her vision.  She denies history of arrhythmia, or cardiac disease.  She reports she does have anxiety but does not have any medications on hand to take.  She states she reached out to her OB but states she did not need follow-up following the type of surgery she had.  She reports she is between PCP and her appointment is scheduled for December 19.  Patient states episodes that she has at home are consistent with the episode she had on arrival to the emergency room.  She reports her heart rate jumps to about 140.  She also reports during these episodes she is unable to lie flat because it makes her breathing worse.   The history is provided by the patient. No language interpreter was used.      Past Medical History:  Diagnosis Date   Anxiety    Depression    GERD (gastroesophageal reflux disease)    Retained intrauterine contraceptive device (IUD) 04/07/2021   Wears partial dentures    upper    Patient Active Problem List   Diagnosis Date Noted   Anxiety and depression 02/24/2017    Past Surgical History:  Procedure Laterality Date   HYSTEROSCOPY WITH RESECTOSCOPE N/A 04/17/2021   Procedure: HYSTEROSCOPY;  Surgeon: Servando Salina, MD;  Location: [facility];  Service: Gynecology;   Laterality: N/A;   NO PAST SURGERIES     TUBAL LIGATION Bilateral 04/17/2021   Procedure: BILATERAL TUBAL LIGATION;  Surgeon: Servando Salina, MD;  Location: [facility];  Service: Gynecology;  Laterality: Bilateral;     OB History   No obstetric history on file.     Family History  Problem Relation Age of Onset   Bipolar disorder Mother    Schizophrenia Mother    Anxiety disorder Mother    Anxiety disorder Brother    Depression Brother    Post-traumatic stress disorder Brother    Schizophrenia Maternal Grandfather    Suicidality Neg Hx     Social History   Tobacco Use   Smoking status: Never   Smokeless tobacco: Never  Vaping Use   Vaping Use: Never used  Substance Use Topics   Alcohol use: Yes    Comment: occ   Drug use: No    Home Medications Prior to Admission medications   Medication Sig Start Date End Date Taking? Authorizing Provider  cholecalciferol (VITAMIN D) 1000 units tablet Take 1,000 Units by mouth as needed.    [provider]  ibuprofen (ADVIL) 800 MG tablet Take 1 tablet (800 mg total) by mouth every 8 (eight) hours as needed. 04/17/21   Servando Salina, MD  Multiple Vitamins-Minerals (MULTIVITAMIN WITH MINERALS) tablet Take 1 tablet by mouth daily. Mary ruth multivitamin daily with vitamin e    [provider]  pantoprazole (PROTONIX) 40 MG  tablet Take 40 mg by mouth 2 (two) times a week.    [provider]    Allergies    Other and Pork-derived products  Review of Systems   Review of Systems  Constitutional:  Negative for activity change, appetite change, chills, fever and unexpected weight change.  Eyes:  Negative for visual disturbance.  Respiratory:  Positive for shortness of breath. Negative for cough.   Cardiovascular:  Positive for palpitations. Negative for chest pain and leg swelling.  Gastrointestinal:  Negative for abdominal pain, nausea and vomiting.  Genitourinary:  Negative for  dysuria, vaginal bleeding and vaginal discharge.  Musculoskeletal:  Negative for arthralgias.  Neurological:  Negative for weakness, light-headedness and headaches.  Psychiatric/Behavioral:  The patient is nervous/anxious.   All other systems reviewed and are negative.  Physical Exam Updated Vital Signs BP 118/76   Pulse 93   Temp 98.1 F (36.7 C) (Oral)   Resp 12   Ht 5' (1.524 m)   Wt 100 kg   SpO2 100%   BMI 43.06 kg/m   Physical Exam Vitals and nursing note reviewed.  Constitutional:      General: She is not in acute distress.    Appearance: Normal appearance. She is not ill-appearing.  HENT:     Head: Normocephalic and atraumatic.     Nose: Nose normal.  Eyes:     General: No scleral icterus.    Extraocular Movements: Extraocular movements intact.     Conjunctiva/sclera: Conjunctivae normal.  Cardiovascular:     Rate and Rhythm: Normal rate and regular rhythm.     Pulses: Normal pulses.     Heart sounds: Normal heart sounds.  Pulmonary:     Effort: Pulmonary effort is normal. No respiratory distress.     Breath sounds: Normal breath sounds. No wheezing or rales.  Abdominal:     General: There is no distension.     Tenderness: There is no abdominal tenderness.  Musculoskeletal:        General: Normal range of motion.     Cervical back: Normal range of motion.  Skin:    General: Skin is warm and dry.  Neurological:     General: No focal deficit present.     Mental Status: She is alert. Mental status is at baseline.    ED Results / Procedures / Treatments   Labs (all labs ordered are listed, but only abnormal results are displayed) Labs Reviewed  BASIC METABOLIC PANEL - Abnormal; Notable for the following components:      Result Value   Glucose, Bld 118 (*)    BUN <5 (*)    All other components within normal limits  I-STAT CHEM 8, ED - Abnormal; Notable for the following components:   BUN <3 (*)    Glucose, Bld 113 (*)    All other components within  normal limits  CBC WITH DIFFERENTIAL/PLATELET  BRAIN NATRIURETIC PEPTIDE  TSH  TROPONIN I (HIGH SENSITIVITY)  TROPONIN I (HIGH SENSITIVITY)    EKG None  Radiology DG Chest 2 View  Result Date: 05/08/2021 CLINICAL DATA:  Shortness of breath.  Chest pain. EXAM: CHEST - 2 VIEW COMPARISON:  Chest XR, 04/06/2018 and 11/11/2017. CT chest, 05/08/2021 and 04/06/2018. FINDINGS: The heart size and mediastinal contours are within normal limits. Both lungs are clear. The visualized skeletal structures are unremarkable. IMPRESSION: Normal chest Electronically Signed   By: Michaelle Birks M.D.   On: 05/08/2021 08:00   CT Angio Chest PE W/Cm &/Or Wo  Cm  Result Date: 05/08/2021 CLINICAL DATA:  PE suspected. High probability. Shortness of breath. EXAM: CT ANGIOGRAPHY CHEST WITH CONTRAST TECHNIQUE: Multidetector CT imaging of the chest was performed using the standard protocol during bolus administration of intravenous contrast. Multiplanar CT image reconstructions and MIPs were obtained to evaluate the vascular anatomy. CONTRAST:  OMNIPAQUE IOHEXOL 350 MG/ML SOLN COMPARISON:  Chest XR, concurrent.  CTA PE, 04/06/2018. FINDINGS: Cardiovascular: Satisfactory opacification of the pulmonary arteries to the segmental level. No evidence of segmental or larger pulmonary embolus. Normal heart size. No pericardial effusion. Mediastinum/Nodes: A small volume residual thymus is present, similar to comparison and greater than expected in a patient of this age. No enlarged mediastinal, hilar, or axillary lymph nodes. Thyroid gland, trachea, and esophagus demonstrate no significant findings. Lungs/Pleura: Lungs are clear. No pleural effusion or pneumothorax. Upper Abdomen: No acute abnormality.  Small hiatus hernia. Musculoskeletal: No chest wall abnormality. No acute or significant osseous findings. Review of the MIP images confirms the above findings. IMPRESSION: 1. No segmental or larger pulmonary embolus. 2. No acute  thoracic abnormality. Electronically Signed   By: Roanna Banning M.D.   On: 05/08/2021 08:12    Procedures Procedures   Medications Ordered in ED Medications  sodium chloride 0.9 % bolus 1,000 mL (1,000 mLs Intravenous New Bag/Given 05/08/21 1401)  iohexol (OMNIPAQUE) 350 MG/ML injection 100 mL (100 mLs Intravenous Contrast Given 05/08/21 0800)    ED Course  I have reviewed the triage vital signs and the nursing notes.  Pertinent labs & imaging results that were available during my care of the patient were reviewed by me and considered in my medical decision making (see chart for details).    MDM Rules/Calculators/A&P                           34 year old female presents today for evaluation of 2-week duration of intermittent episodes of palpitations, and shortness of breath.  She reports during these episodes her heart rate increases to about 140.  These episodes self resolve.  She states these episodes also exacerbate her anxiety.  She states she was previously diagnosed with anxiety but does not have any medications on hand.  She is worried she may have a complication from surgery or reaction anesthesia.  Her initial work-up significant for an unremarkable CBC, BMP with glucose of 118 otherwise unremarkable, troponin negative x2, BNP of 9.4, chest x-ray and CTA angiogram are both unremarkable.  Patient's initial EKG on presentation was significant for sinus tachycardia with rates of 144.  She states this episode was similar to the episode she has at home.  This episode self resolved.  Extensive discussion had regarding patient's reassuring work-up so far.  Patient ambulated around the unit and maintain sats greater than 99, stable heart rate without tachycardia.  Will add on TSH to her work-up as well. Discussed with patient that TSH was added on after the previous work-up and will take additional 30 to 40 minutes to result.  Patient states given that she has had reassuring work-up up until this  point that she will prefer not to wait for the results and follow-up with her PCP for TSH. Discussed importance of follow-up with her PCP for further work-up.  She has an appointment scheduled for 12/19.  She will call to schedule a sooner appointment.  Return precautions discussed.  Patient voices understanding and is in agreement with plan.   Final Clinical Impression(s) / ED Diagnoses  Final diagnoses:  None    Rx / DC Orders ED Discharge Orders     None        Evlyn Courier, Hershal Coria 05/08/21 1530    Blanchie Dessert, MD 05/12/21 2136

## 2021-05-08 NOTE — Discharge Instructions (Addendum)
Your work-up today including blood work, EKG, chest x-ray, CT scan of your chest were all reassuring without any cause of concerning cause of your palpitations or shortness of breath.  You are able to ambulate in the halls without shortness of breath, drop in your oxygen, or increase in your heart rate.  You did mention when these episodes occur you have worsening of your anxiety.  I will send in Atarax for you to keep on hand and use when you feel anxious.  I do recommend you follow-up with your primary care provider for further work-up of your symptoms.

## 2021-05-11 ENCOUNTER — Emergency Department (HOSPITAL_COMMUNITY): Payer: 59

## 2021-05-11 ENCOUNTER — Emergency Department (HOSPITAL_COMMUNITY)
Admission: EM | Admit: 2021-05-11 | Discharge: 2021-05-11 | Disposition: A | Discharge status: Home / Self Care | Payer: 59 | Attending: Emergency Medicine | Admitting: Emergency Medicine

## 2021-05-11 DIAGNOSIS — R42 Dizziness and giddiness: Secondary | ICD-10-CM | POA: Insufficient documentation

## 2021-05-11 DIAGNOSIS — R0602 Shortness of breath: Secondary | ICD-10-CM | POA: Diagnosis not present

## 2021-05-11 DIAGNOSIS — N9489 Other specified conditions associated with female genital organs and menstrual cycle: Secondary | ICD-10-CM | POA: Insufficient documentation

## 2021-05-11 DIAGNOSIS — R0600 Dyspnea, unspecified: Secondary | ICD-10-CM | POA: Diagnosis not present

## 2021-05-11 LAB — CBC WITH DIFFERENTIAL/PLATELET
Abs Immature Granulocytes: 0.03 10*3/uL (ref 0.00–0.07)
Basophils Absolute: 0 10*3/uL (ref 0.0–0.1)
Basophils Relative: 0 %
Eosinophils Absolute: 0.2 10*3/uL (ref 0.0–0.5)
Eosinophils Relative: 2 %
HCT: 37.5 % (ref 36.0–46.0)
Hemoglobin: 11.9 g/dL — ABNORMAL LOW (ref 12.0–15.0)
Immature Granulocytes: 0 %
Lymphocytes Relative: 26 %
Lymphs Abs: 2.9 10*3/uL (ref 0.7–4.0)
MCH: 28.5 pg (ref 26.0–34.0)
MCHC: 31.7 g/dL (ref 30.0–36.0)
MCV: 89.9 fL (ref 80.0–100.0)
Monocytes Absolute: 0.7 10*3/uL (ref 0.1–1.0)
Monocytes Relative: 6 %
Neutro Abs: 7.1 10*3/uL (ref 1.7–7.7)
Neutrophils Relative %: 66 %
Platelets: 314 10*3/uL (ref 150–400)
RBC: 4.17 MIL/uL (ref 3.87–5.11)
RDW: 15.5 % (ref 11.5–15.5)
WBC: 11 10*3/uL — ABNORMAL HIGH (ref 4.0–10.5)
nRBC: 0 % (ref 0.0–0.2)

## 2021-05-11 LAB — BASIC METABOLIC PANEL
Anion gap: 6 (ref 5–15)
BUN: <5 mg/dL — ABNORMAL LOW (ref 6–20)
CO2: 25 mmol/L (ref 22–32)
Calcium: 9.2 mg/dL (ref 8.9–10.3)
Chloride: 107 mmol/L (ref 98–111)
Creatinine, Ser: 0.77 mg/dL (ref 0.44–1.00)
GFR, Estimated: >60 mL/min (ref >60)
Glucose, Bld: 100 mg/dL — ABNORMAL HIGH (ref 70–99)
Potassium: 4 mmol/L (ref 3.5–5.1)
Sodium: 138 mmol/L (ref 135–145)

## 2021-05-11 LAB — I-STAT BETA HCG BLOOD, ED (MC, WL, AP ONLY): I-stat hCG, quantitative: <5 m[IU]/mL (ref <5)

## 2021-05-11 LAB — BRAIN NATRIURETIC PEPTIDE: B Natriuretic Peptide: 102.2 pg/mL — ABNORMAL HIGH (ref 0.0–100.0)

## 2021-05-11 NOTE — ED Notes (Signed)
Pt ambulated in hallway multiple times. O2 sats 98 entire time

## 2021-05-11 NOTE — ED Provider Notes (Signed)
Emergency Medicine Provider Triage Evaluation Note  Dana Todd , a 35 y.o. female  was evaluated in triage.  Patient states that today while she was working, she felt like she was going to pass out and had associated dizziness and shortness of breath.  She has had similar symptoms since early November.  She did have surgery with a hysteroscopy on November 11th and has been having similar sensations ever since then.  She has not had any associated chest pain.  She says that she has been checking her oxygen levels at home and they have been anywhere between the low 70s to high 80s at times.  She is unsure of her last menstrual period.  She does not smoke, use drugs, or alcohol.  She is notably anxious in triage.  She was recently evaluated in the ED on 12/2 with palpitations and other similar symptoms. She had a negative workup at that time  Review of Systems  Positive:  Negative:   Physical Exam  BP 129/90 (BP Location: Right Arm)   Pulse (!) 107   Temp 99.3 F (37.4 C) (Oral)   Resp 20   LMP 04/19/2021   SpO2 100%  Gen:   Awake, no distress   Resp:  Normal effort  MSK:   Moves extremities without difficulty  Other:  anxious  Medical Decision Making  Medically screening exam initiated at 11:17 AM.  Appropriate orders placed.  Nariya Neumeyer was informed that the remainder of the evaluation will be completed by another provider, this initial triage assessment does not replace that evaluation, and the importance of remaining in the ED until their evaluation is complete.  WELLS int to high risk.    Therese Sarah 05/11/21 1122    Benjiman Core, MD 05/12/21 [phone]

## 2021-05-11 NOTE — ED Notes (Signed)
Patient discharge instructions reviewed with the patient. The patient verbalized understanding of instructions. Patient discharged. 

## 2021-05-11 NOTE — ED Notes (Signed)
Pt refused ct scan until seeing MD, notified Delorise Shiner, [place]

## 2021-05-11 NOTE — ED Triage Notes (Signed)
Pt. Stated, I suffer form hypoxia and Im SOB and some dizziness in the middle of night.

## 2021-05-14 ENCOUNTER — Other Ambulatory Visit: Payer: Self-pay

## 2021-05-14 ENCOUNTER — Encounter (HOSPITAL_COMMUNITY): Payer: Self-pay

## 2021-05-14 ENCOUNTER — Emergency Department (HOSPITAL_COMMUNITY)
Admission: EM | Admit: 2021-05-14 | Discharge: 2021-05-14 | Disposition: A | Discharge status: Home / Self Care | Payer: 59 | Attending: Emergency Medicine | Admitting: Emergency Medicine

## 2021-05-14 ENCOUNTER — Emergency Department (HOSPITAL_COMMUNITY): Payer: 59

## 2021-05-14 DIAGNOSIS — R42 Dizziness and giddiness: Secondary | ICD-10-CM | POA: Diagnosis present

## 2021-05-14 DIAGNOSIS — R197 Diarrhea, unspecified: Secondary | ICD-10-CM | POA: Diagnosis not present

## 2021-05-14 DIAGNOSIS — R0602 Shortness of breath: Secondary | ICD-10-CM | POA: Insufficient documentation

## 2021-05-14 DIAGNOSIS — K5792 Diverticulitis of intestine, part unspecified, without perforation or abscess without bleeding: Secondary | ICD-10-CM | POA: Insufficient documentation

## 2021-05-14 DIAGNOSIS — R002 Palpitations: Secondary | ICD-10-CM | POA: Insufficient documentation

## 2021-05-14 DIAGNOSIS — R000 Tachycardia, unspecified: Secondary | ICD-10-CM | POA: Insufficient documentation

## 2021-05-14 LAB — CBC WITH DIFFERENTIAL/PLATELET
Abs Immature Granulocytes: 0.03 10*3/uL (ref 0.00–0.07)
Basophils Absolute: 0 10*3/uL (ref 0.0–0.1)
Basophils Relative: 0 %
Eosinophils Absolute: 0.2 10*3/uL (ref 0.0–0.5)
Eosinophils Relative: 2 %
HCT: 35.8 % — ABNORMAL LOW (ref 36.0–46.0)
Hemoglobin: 11.5 g/dL — ABNORMAL LOW (ref 12.0–15.0)
Immature Granulocytes: 0 %
Lymphocytes Relative: 31 %
Lymphs Abs: 3.2 10*3/uL (ref 0.7–4.0)
MCH: 28.5 pg (ref 26.0–34.0)
MCHC: 32.1 g/dL (ref 30.0–36.0)
MCV: 88.6 fL (ref 80.0–100.0)
Monocytes Absolute: 0.7 10*3/uL (ref 0.1–1.0)
Monocytes Relative: 7 %
Neutro Abs: 6 10*3/uL (ref 1.7–7.7)
Neutrophils Relative %: 60 %
Platelets: 301 10*3/uL (ref 150–400)
RBC: 4.04 MIL/uL (ref 3.87–5.11)
RDW: 15.2 % (ref 11.5–15.5)
WBC: 10.2 10*3/uL (ref 4.0–10.5)
nRBC: 0 % (ref 0.0–0.2)

## 2021-05-14 LAB — COMPREHENSIVE METABOLIC PANEL
ALT: 10 U/L (ref 0–44)
AST: 24 U/L (ref 15–41)
Albumin: 3.4 g/dL — ABNORMAL LOW (ref 3.5–5.0)
Alkaline Phosphatase: 73 U/L (ref 38–126)
Anion gap: 9 (ref 5–15)
BUN: 5 mg/dL — ABNORMAL LOW (ref 6–20)
CO2: 24 mmol/L (ref 22–32)
Calcium: 9.2 mg/dL (ref 8.9–10.3)
Chloride: 105 mmol/L (ref 98–111)
Creatinine, Ser: 0.81 mg/dL (ref 0.44–1.00)
GFR, Estimated: >60 mL/min (ref >60)
Glucose, Bld: 104 mg/dL — ABNORMAL HIGH (ref 70–99)
Potassium: 4.5 mmol/L (ref 3.5–5.1)
Sodium: 138 mmol/L (ref 135–145)
Total Bilirubin: 1.1 mg/dL (ref 0.3–1.2)
Total Protein: 7.5 g/dL (ref 6.5–8.1)

## 2021-05-14 MED ORDER — IOHEXOL 350 MG/ML SOLN: 95.0000 mL | Freq: Once | INTRAVENOUS | Status: DC | PRN

## 2021-05-14 MED ORDER — IOHEXOL 300 MG/ML  SOLN
100.0000 mL | Freq: Once | INTRAMUSCULAR | Status: AC | PRN
  Administered 2021-05-14: 100 mL via INTRAVENOUS

## 2021-05-14 MED ORDER — SODIUM CHLORIDE 0.9 % IV BOLUS
1000.0000 mL | Freq: Once | INTRAVENOUS | Status: AC
  Administered 2021-05-14: 1000 mL via INTRAVENOUS

## 2021-05-14 NOTE — Discharge Instructions (Addendum)
Follow-up at your scheduled appointment tomorrow. Call the GI doctor listed below to set up a follow-up appointment for further evaluation. You may need to follow-up with a cardiologist (if this is not who you are following up with tomorrow.)  You may call the office below to set up an appointment. Return to the emergency room with any new, worsening, concerning symptoms.

## 2021-05-14 NOTE — ED Notes (Signed)
Pt verbalizes understanding of discharge instructions. Opportunity for questions and answers were provided. Pt discharged from the ED.   ?

## 2021-05-14 NOTE — ED Provider Notes (Signed)
Emergency Medicine Provider Triage Evaluation Note  Dana Todd , a 35 y.o. female  was evaluated in triage.  Pt complains of concern for infection.  Patient feels like she needs antibiotics at this time.  States that she believes she has diverticulitis as it runs in her family and she has had intestinal issues for some time now.  States that she has chills that are so bad she clenches her teeth and can barely breathe and believes this is causing her O2 sat to drop into the 70s.  Several ER visits, no source found.  Declines pregnancy test today.  States that she is not sexually active, has had negative tests, and has had been sterilized and does not want to be charged for another pregnancy test today.  Review of Systems  Positive: chills Negative: Abdominal pain  Physical Exam  LMP 04/19/2021  Gen:   Awake, no distress   Resp:  Normal effort  MSK:   Moves extremities without difficulty  Other:    Medical Decision Making  Medically screening exam initiated at 6:03 AM.  Appropriate orders placed.  [name] was informed that the remainder of the evaluation will be completed by another provider, this initial triage assessment does not replace that evaluation, and the importance of remaining in the ED until their evaluation is complete.  O2 sat 100% on room air.  Able to speak in complete paragraphs without difficulty.  We will not order pregnancy test as requested by patient.  Will order CBC and CMP to compare to prior on file.   [name], PA-C 05/14/21 [name]    Sabas Sous, MD 05/14/21 [phone]

## 2021-05-14 NOTE — ED Provider Notes (Signed)
[facility] EMERGENCY DEPARTMENT Provider Note   CSN: [id number] Arrival date & time: 05/14/21  [id number]     History Chief Complaint  Patient presents with   Shortness of Breath    Dana Todd is a 35 y.o. female presenting for evaluation of shortness of breath.  Patient states she has been having intermittent episodes of dizziness, palpitations, shortness of breath with subsequent 4 episodes of diarrhea.  She also states during these episodes she often becomes hypoxic down to the 70s per her home monitor.  These episodes occur at night and are more likely to occur when she is driving or after eating.  She has been evaluated for this multiple times, with an overall reassuring work-up.  She has an appointment scheduled tomorrow with pulmonology.  He is facet started after she had a hysteroscopy and tubal ligation.  She was not started on any hormones at that time, has not had a follow-up with OB/GYN.  She is not taking anything for the symptoms.  She has been prescribed antianxiety medication which helped with her anxiety, but not resolve the symptoms.  She denies fever, chest pain, nausea, vomiting, abdominal pain, urinary symptoms.  She is not having any vaginal bleeding, has not yet had a period since her hysteroscopy. Pt is concerned she has UC and/or diverticulitis.   Additional history obtained per chart review.  History of anxiety, depression, GERD.  I reviewed recent ER visits including 2 from this week during which patient had reassuring labs, negative CTA of the chest, normal EKGs, and normal CXRs.   HPI     Past Medical History:  Diagnosis Date   Anxiety    Depression    GERD (gastroesophageal reflux disease)    Retained intrauterine contraceptive device (IUD) 04/07/2021   Wears partial dentures    upper    Patient Active Problem List   Diagnosis Date Noted   Anxiety and depression 02/24/2017    Past Surgical History:  Procedure Laterality Date    HYSTEROSCOPY WITH RESECTOSCOPE N/A 04/17/2021   Procedure: HYSTEROSCOPY;  Surgeon: Servando Salina, MD;  Location: [facility];  Service: Gynecology;  Laterality: N/A;   NO PAST SURGERIES     TUBAL LIGATION Bilateral 04/17/2021   Procedure: BILATERAL TUBAL LIGATION;  Surgeon: Servando Salina, MD;  Location: [facility];  Service: Gynecology;  Laterality: Bilateral;     OB History   No obstetric history on file.     Family History  Problem Relation Age of Onset   Bipolar disorder Mother    Schizophrenia Mother    Anxiety disorder Mother    Anxiety disorder Brother    Depression Brother    Post-traumatic stress disorder Brother    Schizophrenia Maternal Grandfather    Suicidality Neg Hx     Social History   Tobacco Use   Smoking status: Never   Smokeless tobacco: Never  Vaping Use   Vaping Use: Never used  Substance Use Topics   Alcohol use: Yes    Comment: occ   Drug use: No    Home Medications Prior to Admission medications   Medication Sig Start Date End Date Taking? Authorizing Provider  cholecalciferol (VITAMIN D) 1000 units tablet Take 1,000 Units by mouth daily.   Yes [provider]  hydrOXYzine (ATARAX) 25 MG tablet Take 1 tablet (25 mg total) by mouth every 8 (eight) hours as needed for anxiety. 05/08/21  Yes Deatra Canter, Amjad, PA-C  Multiple Vitamins-Minerals (MULTIVITAMIN WITH MINERALS) tablet  Take 1 tablet by mouth daily. Mary ruth multivitamin daily with vitamin e   Yes [provider]  ibuprofen (ADVIL) 800 MG tablet Take 1 tablet (800 mg total) by mouth every 8 (eight) hours as needed. Patient not taking: Reported on 05/14/2021 04/17/21   Servando Salina, MD    Allergies    Other and Pork-derived products  Review of Systems   Review of Systems  Respiratory:  Positive for shortness of breath.   Cardiovascular:  Positive for palpitations.  Gastrointestinal:  Positive for diarrhea.  Neurological:   Positive for dizziness.  All other systems reviewed and are negative.  Physical Exam Updated Vital Signs BP 124/86   Pulse 97   Temp 98.4 F (36.9 C) (Oral)   Resp (!) 21   Ht 5' (1.524 m)   Wt 104.3 kg   LMP 04/19/2021   SpO2 99%   BMI 44.92 kg/m   Physical Exam Vitals and nursing note reviewed.  Constitutional:      General: She is not in acute distress.    Appearance: Normal appearance. She is obese.     Comments: Resting in the bed in NAD  HENT:     Head: Normocephalic and atraumatic.  Eyes:     Extraocular Movements: Extraocular movements intact.     Conjunctiva/sclera: Conjunctivae normal.     Pupils: Pupils are equal, round, and reactive to light.  Cardiovascular:     Rate and Rhythm: Regular rhythm. Tachycardia present.     Pulses: Normal pulses.     Comments: Mildly tachycardic Pulmonary:     Effort: Pulmonary effort is normal. No respiratory distress.     Breath sounds: Normal breath sounds. No wheezing.     Comments: Speaking in full sentences.  Clear lung sounds in all fields. Abdominal:     General: There is no distension.     Palpations: Abdomen is soft. There is no mass.     Tenderness: There is no abdominal tenderness. There is no guarding or rebound.     Comments: No ttp of the abd  Musculoskeletal:        General: Normal range of motion.     Cervical back: Normal range of motion and neck supple.     Right lower leg: No edema.     Left lower leg: No edema.  Skin:    General: Skin is warm and dry.     Capillary Refill: Capillary refill takes less than 2 seconds.  Neurological:     Mental Status: She is alert and oriented to person, place, and time.  Psychiatric:        Mood and Affect: Mood and affect normal.        Speech: Speech normal.        Behavior: Behavior normal.    ED Results / Procedures / Treatments   Labs (all labs ordered are listed, but only abnormal results are displayed) Labs Reviewed  COMPREHENSIVE METABOLIC PANEL -  Abnormal; Notable for the following components:      Result Value   Glucose, Bld 104 (*)    BUN 5 (*)    Albumin 3.4 (*)    All other components within normal limits  CBC WITH DIFFERENTIAL/PLATELET - Abnormal; Notable for the following components:   Hemoglobin 11.5 (*)    HCT 35.8 (*)    All other components within normal limits    EKG None  Radiology CT ABDOMEN PELVIS W CONTRAST  Result Date: 05/14/2021 CLINICAL DATA:  Diffuse  abdominal pain with chills. Inflammatory bowel disease (IBD) EXAM: CT ABDOMEN AND PELVIS WITH CONTRAST TECHNIQUE: Multidetector CT imaging of the abdomen and pelvis was performed using the standard protocol following bolus administration of intravenous contrast. CONTRAST:  OMNIPAQUE IOHEXOL 300 MG/ML  SOLN COMPARISON:  None. FINDINGS: Lower chest: Included lung bases are clear.  Heart size is normal. Hepatobiliary: Unremarkable appearance of the liver. No focal liver abnormality. Gallbladder is partially contracted but appears otherwise unremarkable. No hyperdense gallstone. Pancreas: Unremarkable. No pancreatic ductal dilatation or surrounding inflammatory changes. Spleen: Normal in size without focal abnormality. Adrenals/Urinary Tract: Unremarkable adrenal glands. Kidneys enhance symmetrically without focal lesion, stone, or hydronephrosis. Ureters are nondilated. Urinary bladder appears unremarkable. Stomach/Bowel: Stomach is within normal limits. Appendix appears normal (series 6, image 62). Terminal ileum within normal limits. No evidence of bowel wall thickening, distention, or inflammatory changes. Vascular/Lymphatic: No significant vascular findings are present. No enlarged abdominal or pelvic lymph nodes. Reproductive: Unremarkable uterus. Bilateral ovaries are within normal limits. Other: No free fluid. No abdominopelvic fluid collection. No pneumoperitoneum. No abdominal wall hernia. Musculoskeletal: No acute or significant osseous findings. IMPRESSION: No  acute abdominopelvic findings. Electronically Signed   By: Duanne Guess D.O.   On: 05/14/2021 16:45    Procedures Procedures   Medications Ordered in ED Medications  sodium chloride 0.9 % bolus 1,000 mL (0 mLs Intravenous Stopped 05/14/21 1738)  iohexol (OMNIPAQUE) 300 MG/ML solution 100 mL (100 mLs Intravenous Contrast Given 05/14/21 1638)    ED Course  I have reviewed the triage vital signs and the nursing notes.  Pertinent labs & imaging results that were available during my care of the patient were reviewed by me and considered in my medical decision making (see chart for details).    MDM Rules/Calculators/A&P                           Patient presenting for evaluation of episodes of dizziness, palpitations, shortness of breath, diarrhea.  On exam, patient peers nontoxic.  She is mildly tachycardic, but otherwise vital signs are reassuring.  She has been evaluated for this multiple times including a negative PE work-up, overall reassuring cardiac work-up.  She is extremely concerned about possible diverticulitis or UC.  As she is having diarrheal episodes with this, and had recent gyn procedure, will obtain CT scan.  Labs obtained from triage interpreted by me, overall reassuring.  Hemoglobin slightly lower than previous, however not dropping at a concerning rate and not needing transfusion.  Electrolytes overall stable.  Case discussed with attending, Dr. Stevie Kern evaluated the patient.  CT scan negative for acute findings.  Discussed with patient.  Discussed that at this time there does not appear to be an acute or life-threatening condition requiring hospitalization.  Discussed she is may be having anxiety versus vagal episodes.  Discussed this is may be a hormonal problem that will need to be evaluated by GYN.  Discussed importance of follow-up at her scheduled appointment tomorrow.  Information given for GI and cardiology.  At this time, patient appears safe for discharge.  Return  precautions given.  Patient states she understands and agrees to plan.  Final Clinical Impression(s) / ED Diagnoses Final diagnoses:  Dizziness  Shortness of breath  Palpitations  Diarrhea, unspecified type    Rx / DC Orders ED Discharge Orders     None        Alveria Apley, PA-C 05/14/21 1751    Milagros Loll,  MD 05/15/21 1512    Lucrezia Starch, MD 05/15/21 [phone]

## 2021-05-14 NOTE — ED Triage Notes (Signed)
PT arrives POV for eval of SOB and chills. Pt reports sats in the 70s at home. Reports she had tubal ligation in November and has felt badly since that time.

## 2021-05-15 ENCOUNTER — Encounter: Payer: Self-pay | Admitting: Pulmonary Disease

## 2021-05-15 ENCOUNTER — Ambulatory Visit (INDEPENDENT_AMBULATORY_CARE_PROVIDER_SITE_OTHER): Payer: 59 | Admitting: Pulmonary Disease

## 2021-05-15 ENCOUNTER — Other Ambulatory Visit: Payer: Self-pay

## 2021-05-15 VITALS — BP 118/74 | HR 106 | Temp 97.9°F | Ht 60.0 in | Wt 237.6 lb

## 2021-05-15 DIAGNOSIS — Z6841 Body Mass Index (BMI) 40.0 and over, adult: Secondary | ICD-10-CM | POA: Diagnosis not present

## 2021-05-15 DIAGNOSIS — R0609 Other forms of dyspnea: Secondary | ICD-10-CM | POA: Diagnosis not present

## 2021-05-15 NOTE — Progress Notes (Signed)
@Patient  ID: Dana Todd, female    DOB: 1985/12/18, 35 y.o.   MRN: [id number]  Chief Complaint  Patient presents with   Hospitalization [facility] follow up due to [facility] and high heart rate. Pt states that she was having issues breathing    Referring provider: Lin Landsman, MD  HPI:   35 y.o. woman whom we are seeing in consultation for evaluation of dyspnea on exertion.  ED note x4 in December 2022 reviewed.  Report shortness of breath onset in November 2022 following tubal ligation.  Episodes of severe dyspnea with reported low oxygen at home.  Slow to the 70s.  This is on her apple watch.  At the time her heart rate is also quite high.  Up to 140s.  She can stand up, take a few deep breaths, hold breath and oxygen saturations will pop up to the high 90s.  Symptoms worse when sitting down or lying supine.  No seem a bit worse in the mornings, quickly at work.  No other relieving or exacerbating factors.  No seasonal environmental factors she can apply to make things better or worse.  She is been seen in the emergency room 4 times in December 2022 for similar symptoms.  Chest imaging clear as below.  Oxygen saturation documented 100% on room air on ED visit 12/2.  Oxygen saturation documented 98 to 100% on room air on ED visit 12/5.  Ambulatory O2 sats documented is 98%.  Oxygen saturation documented 97 to 100% with 92% reading on 05/14/2021 ED visit.  She feels like she cannot get a deep breath, belly pressing up on her lungs.  Reports about 80 pound weight gain over the last 3 years.  Reviewed multiple chest images, chest x-ray 05/08/2021 reviewed interpreted as clear lungs bilaterally.  CT PE protocol 05/08/2021 reviewed interpreted as clear lungs, no evidence of pulmonary embolus.  Chest x-ray 05/11/2021 reviewed interpreted as clear lungs bilaterally.  CTA PE protocol 03/2018 reviewed interpreted clear lungs, no pulmonary embolism visualized.  Chest x-ray 04/06/2018 reviewed  interpreted as clear lungs bilaterally.  PMH: Anxiety Surgical history: Tubal ligation Family history: Mother with bipolar, schizophrenia, anxiety Social history: Never smoker, lives in [place] / Pulmonary Flowsheets:   ACT:  No flowsheet data found.  MMRC: No flowsheet data found.  Epworth:  No flowsheet data found.  Tests:   FENO:  No results found for: NITRICOXIDE  PFT: No flowsheet data found.  WALK:  No flowsheet data found.  Imaging: DG Chest 2 View  Result Date: 05/11/2021 CLINICAL DATA:  Shortness of breath.  Hypoxia. EXAM: CHEST - 2 VIEW COMPARISON:  05/08/2021 FINDINGS: Heart size is normal. Mediastinal shadows are normal. The lungs are clear. No bronchial thickening. No infiltrate, mass, effusion or collapse. Pulmonary vascularity is normal. No bony abnormality. IMPRESSION: Normal chest Electronically Signed   By: Nelson Chimes M.D.   On: 05/11/2021 11:39   DG Chest 2 View  Result Date: 05/08/2021 CLINICAL DATA:  Shortness of breath.  Chest pain. EXAM: CHEST - 2 VIEW COMPARISON:  Chest XR, 04/06/2018 and 11/11/2017. CT chest, 05/08/2021 and 04/06/2018. FINDINGS: The heart size and mediastinal contours are within normal limits. Both lungs are clear. The visualized skeletal structures are unremarkable. IMPRESSION: Normal chest Electronically Signed   By: Michaelle Birks M.D.   On: 05/08/2021 08:00   CT Angio Chest PE W/Cm &/Or Wo Cm  Result Date: 05/08/2021 CLINICAL DATA:  PE suspected. High probability. Shortness of breath.  EXAM: CT ANGIOGRAPHY CHEST WITH CONTRAST TECHNIQUE: Multidetector CT imaging of the chest was performed using the standard protocol during bolus administration of intravenous contrast. Multiplanar CT image reconstructions and MIPs were obtained to evaluate the vascular anatomy. CONTRAST:  188mL OMNIPAQUE IOHEXOL 350 MG/ML SOLN COMPARISON:  Chest XR, concurrent.  CTA PE, 04/06/2018. FINDINGS: Cardiovascular: Satisfactory opacification  of the pulmonary arteries to the segmental level. No evidence of segmental or larger pulmonary embolus. Normal heart size. No pericardial effusion. Mediastinum/Nodes: A small volume residual thymus is present, similar to comparison and greater than expected in a patient of this age. No enlarged mediastinal, hilar, or axillary lymph nodes. Thyroid gland, trachea, and esophagus demonstrate no significant findings. Lungs/Pleura: Lungs are clear. No pleural effusion or pneumothorax. Upper Abdomen: No acute abnormality.  Small hiatus hernia. Musculoskeletal: No chest wall abnormality. No acute or significant osseous findings. Review of the MIP images confirms the above findings. IMPRESSION: 1. No segmental or larger pulmonary embolus. 2. No acute thoracic abnormality. Electronically Signed   By: Michaelle Birks M.D.   On: 05/08/2021 08:12   CT ABDOMEN PELVIS W CONTRAST  Result Date: 05/14/2021 CLINICAL DATA:  Diffuse abdominal pain with chills. Inflammatory bowel disease (IBD) EXAM: CT ABDOMEN AND PELVIS WITH CONTRAST TECHNIQUE: Multidetector CT imaging of the abdomen and pelvis was performed using the standard protocol following bolus administration of intravenous contrast. CONTRAST:  169mL OMNIPAQUE IOHEXOL 300 MG/ML  SOLN COMPARISON:  None. FINDINGS: Lower chest: Included lung bases are clear.  Heart size is normal. Hepatobiliary: Unremarkable appearance of the liver. No focal liver abnormality. Gallbladder is partially contracted but appears otherwise unremarkable. No hyperdense gallstone. Pancreas: Unremarkable. No pancreatic ductal dilatation or surrounding inflammatory changes. Spleen: Normal in size without focal abnormality. Adrenals/Urinary Tract: Unremarkable adrenal glands. Kidneys enhance symmetrically without focal lesion, stone, or hydronephrosis. Ureters are nondilated. Urinary bladder appears unremarkable. Stomach/Bowel: Stomach is within normal limits. Appendix appears normal (series 6, image 62).  Terminal ileum within normal limits. No evidence of bowel wall thickening, distention, or inflammatory changes. Vascular/Lymphatic: No significant vascular findings are present. No enlarged abdominal or pelvic lymph nodes. Reproductive: Unremarkable uterus. Bilateral ovaries are within normal limits. Other: No free fluid. No abdominopelvic fluid collection. No pneumoperitoneum. No abdominal wall hernia. Musculoskeletal: No acute or significant osseous findings. IMPRESSION: No acute abdominopelvic findings. Electronically Signed   By: Davina Poke D.O.   On: 05/14/2021 16:45    Lab Results:  CBC    Component Value Date/Time   WBC 10.2 05/14/2021 0626   RBC 4.04 05/14/2021 0626   HGB 11.5 (L) 05/14/2021 0626   HCT 35.8 (L) 05/14/2021 0626   PLT 301 05/14/2021 0626   MCV 88.6 05/14/2021 0626   MCH 28.5 05/14/2021 0626   MCHC 32.1 05/14/2021 0626   RDW 15.2 05/14/2021 0626   LYMPHSABS 3.2 05/14/2021 0626   MONOABS 0.7 05/14/2021 0626   EOSABS 0.2 05/14/2021 0626   BASOSABS 0.0 05/14/2021 0626    BMET    Component Value Date/Time   NA 138 05/14/2021 0626   K 4.5 05/14/2021 0626   CL 105 05/14/2021 0626   CO2 24 05/14/2021 0626   GLUCOSE 104 (H) 05/14/2021 0626   BUN 5 (L) 05/14/2021 0626   CREATININE 0.81 05/14/2021 0626   CALCIUM 9.2 05/14/2021 0626   GFRNONAA >60 05/14/2021 0626   GFRAA >60 04/06/2018 0925    BNP    Component Value Date/Time   BNP 102.2 (H) 05/11/2021 1125    ProBNP No results  found for: PROBNP  Specialty Problems   None  Allergies  Allergen Reactions   Other     Gelatin stomach pain n/v   Pork-Derived Products     Stomach pain n/v    Immunization History  Administered Date(s) Administered   Influenza,inj,Quad PF,6+ Mos 04/07/2017   Influenza-Unspecified 03/08/2021   Tdap 05/06/2020   Yellow Fever 12/24/2014    Past Medical History:  Diagnosis Date   Anxiety    Depression    GERD (gastroesophageal reflux disease)    Retained  intrauterine contraceptive device (IUD) 04/07/2021   Wears partial dentures    upper    Tobacco History: Social History   Tobacco Use  Smoking Status Never  Smokeless Tobacco Never   Counseling given: Not Answered   Continue to not smoke  Outpatient Encounter Medications as of 05/15/2021  Medication Sig   cholecalciferol (VITAMIN D) 1000 units tablet Take 1,000 Units by mouth daily.   hydrOXYzine (ATARAX) 25 MG tablet Take 1 tablet (25 mg total) by mouth every 8 (eight) hours as needed for anxiety.   ibuprofen (ADVIL) 800 MG tablet Take 1 tablet (800 mg total) by mouth every 8 (eight) hours as needed.   Multiple Vitamins-Minerals (MULTIVITAMIN WITH MINERALS) tablet Take 1 tablet by mouth daily. Mary ruth multivitamin daily with vitamin e   pantoprazole sodium (PROTONIX) 40 mg/20 mL SUSP Pantoprazole   No facility-administered encounter medications on file as of 05/15/2021.     Review of Systems  Review of Systems  No chest pain with exertion.  No orthopnea or PND.  Comprehensive review of systems otherwise negative. Physical Exam  BP 118/74 (BP Location: Left Arm, Patient Position: Sitting, Cuff Size: Normal)   Pulse (!) 106   Temp 97.9 F (36.6 C) (Oral)   Ht 5' (1.524 m)   Wt 237 lb 9.6 oz (107.8 kg)   LMP 04/19/2021   SpO2 99%   BMI 46.40 kg/m   Wt Readings from Last 5 Encounters:  05/15/21 237 lb 9.6 oz (107.8 kg)  05/14/21 230 lb (104.3 kg)  05/08/21 220 lb 7.4 oz (100 kg)  04/17/21 229 lb 9.6 oz (104.1 kg)  05/06/20 215 lb (97.5 kg)    BMI Readings from Last 5 Encounters:  05/15/21 46.40 kg/m  05/14/21 44.92 kg/m  05/08/21 43.06 kg/m  04/17/21 44.84 kg/m  05/06/20 41.99 kg/m     Physical Exam General: Sitting in chair, no acute distress Eyes: EOMI, no icterus Neck: Supple, no JVP Pulmonary: Clear, normal work of breathing Cardiovascular: Tachycardic, 2 out of 6 early systolic murmur noted Abdomen: Nondistended, bowel sounds present MSK:  No synovitis, joint effusion Neuro: Normal gait, no weakness Psych: Normal mood, full affect   Assessment & Plan:   Dyspnea on exertion, reported hypoxemia at home: No documented hypoxemia in the healthcare setting.  Possible error with pulse oximeter or inaccurate reading with pulse oximeter although she endorses has been with good readings at home.  Given no document hypoxemia elsewhere, I think this is plausible.  Coincides with dyspnea, these episodes of hypoxemia.  Also often quite tachycardic during these episodes.  Query underlying tachyarrhythmia causing inaccurate pulse oximeter reading but contributing to dyspnea.  She has murmur on exam today, another concern for possible cardiac contributor.  Referral to cardiology sent today.  Her chest x-ray and CT scan showed no parenchymal abnormality, no pulmonary embolus.  See no pulmonary cause for hypoxemia.  It is possible she has sleep apnea.  We will evaluate with polysomnography  at home.  Her serum bicarbonate at recent ED visits in the mid 20s, not consistent with obesity hypoventilation syndrome.  However it is possible obesity is contributing to hypoxemia the atelectasis and short periods of shunt that her body suddenly compensates for that is not captured in the healthcare setting.  We will evaluate her dyspnea further with pulmonary function tests.   Obesity: She desires weight loss.  Referral to weight loss provider today.  She is interested in surgery in the future if indicated.  Return in about 6 weeks (around 06/26/2021).   Lanier Clam, MD 05/15/2021   This appointment required 85 minutes of patient care (this includes precharting, chart review, review of results, face-to-face care, etc.).

## 2021-05-15 NOTE — Patient Instructions (Addendum)
Nice to meet you  There is some additional evaluation I think we need to do for your shortness of breath and low oxygen levels   We should get pulmonary function test to evaluate how well your lungs are working.  We should evaluate for sleep apnea at night which can cause shortness of breath and low oxygen levels  I sent a referral to the heart doctors to evaluate the high heart rate which sometimes can contribute to low oxygen levels  I sent a referral to Dr. Dalbert Garnet.  She is a weight loss specialist here in [place].  I think is a good first episode that she can counsel you on things that may need to be done prior to surgical evaluation.  Return to clinic in 6 weeks or sooner as needed with Dr. Judeth Horn

## 2021-05-19 NOTE — ED Provider Notes (Signed)
[facility] EMERGENCY DEPARTMENT Provider Note   CSN: [id number] Arrival date & time: 05/11/21  1008     History Chief Complaint  Patient presents with   Shortness of Breath   Dizziness    Dana Todd is a 35 y.o. female.   Shortness of Breath Associated symptoms: no abdominal pain and no chest pain   Dizziness Associated symptoms: shortness of breath   Associated symptoms: no chest pain   Patient presents with shortness of breath.  States she has had poor oxygenation.  States she checks her watch at home and showing sats going down to the 70s.  Has been seen in the ER without desaturation.  No clear cause has been found previous.  States she cannot do the same physical activity that he used to.  States she cannot work because when she talks she keeps getting more short of breath.    Past Medical History:  Diagnosis Date   Anxiety    Depression    GERD (gastroesophageal reflux disease)    Retained intrauterine contraceptive device (IUD) 04/07/2021   Wears partial dentures    upper    Patient Active Problem List   Diagnosis Date Noted   Anxiety and depression 02/24/2017    Past Surgical History:  Procedure Laterality Date   HYSTEROSCOPY WITH RESECTOSCOPE N/A 04/17/2021   Procedure: HYSTEROSCOPY;  Surgeon: Maxie Better, MD;  Location[facility];  Service: Gynecology;  Laterality: N/A;   NO PAST SURGERIES     TUBAL LIGATION Bilateral 04/17/2021   Procedure: BILATERAL TUBAL LIGATION;  Surgeon: Maxie Better, MD;  Location: [facility];  Service: Gynecology;  Laterality: Bilateral;     OB History   No obstetric history on file.     Family History  Problem Relation Age of Onset   Bipolar disorder Mother    Schizophrenia Mother    Anxiety disorder Mother    Anxiety disorder Brother    Depression Brother    Post-traumatic stress disorder Brother    Schizophrenia Maternal Grandfather     Suicidality Neg Hx     Social History   Tobacco Use   Smoking status: Never   Smokeless tobacco: Never  Vaping Use   Vaping Use: Never used  Substance Use Topics   Alcohol use: Yes    Comment: occ   Drug use: No    Home Medications Prior to Admission medications   Medication Sig Start Date End Date Taking? Authorizing Provider  cholecalciferol (VITAMIN D) 1000 units tablet Take 1,000 Units by mouth daily.    [provider]  hydrOXYzine (ATARAX) 25 MG tablet Take 1 tablet (25 mg total) by mouth every 8 (eight) hours as needed for anxiety. 05/08/21   Marita Kansas, PA-C  ibuprofen (ADVIL) 800 MG tablet Take 1 tablet (800 mg total) by mouth every 8 (eight) hours as needed. 04/17/21   Maxie Better, MD  Multiple Vitamins-Minerals (MULTIVITAMIN WITH MINERALS) tablet Take 1 tablet by mouth daily. Mary ruth multivitamin daily with vitamin e    [provider]  pantoprazole sodium (PROTONIX) 40 mg/20 mL SUSP Pantoprazole    [provider]    Allergies    Other and Pork-derived products  Review of Systems   Review of Systems  Constitutional:  Negative for appetite change.  HENT:  Negative for congestion.   Respiratory:  Positive for shortness of breath.   Cardiovascular:  Negative for chest pain.  Gastrointestinal:  Negative for abdominal pain.  Genitourinary:  Negative for flank pain.  Musculoskeletal:  Negative for back pain.  Neurological:  Positive for dizziness.  Psychiatric/Behavioral:  Negative for confusion.    Physical Exam Updated Vital Signs BP 129/76 (BP Location: Right Arm)   Pulse (!) 105   Temp 98.7 F (37.1 C) (Oral)   Resp 16   LMP 04/19/2021   SpO2 98%   Physical Exam Vitals and nursing note reviewed.  Cardiovascular:     Rate and Rhythm: Regular rhythm.  Pulmonary:     Breath sounds: No wheezing, rhonchi or rales.  Chest:     Chest wall: No tenderness.  Abdominal:     Tenderness: There is no abdominal tenderness.   Musculoskeletal:     Cervical back: Neck supple.     Right lower leg: No edema.     Left lower leg: No edema.  Skin:    General: Skin is warm.     Capillary Refill: Capillary refill takes less than 2 seconds.  Neurological:     Mental Status: She is alert.    ED Results / Procedures / Treatments   Labs (all labs ordered are listed, but only abnormal results are displayed) Labs Reviewed  BASIC METABOLIC PANEL - Abnormal; Notable for the following components:      Result Value   Glucose, Bld 100 (*)    BUN <5 (*)    All other components within normal limits  CBC WITH DIFFERENTIAL/PLATELET - Abnormal; Notable for the following components:   WBC 11.0 (*)    Hemoglobin 11.9 (*)    All other components within normal limits  BRAIN NATRIURETIC PEPTIDE - Abnormal; Notable for the following components:   B Natriuretic Peptide 102.2 (*)    All other components within normal limits  I-STAT BETA HCG BLOOD, ED ([facility], [facility], [facility] ONLY)    EKG EKG Interpretation  Date/Time:  Monday May 11 2021 11:13:45 EST Ventricular Rate:  107 PR Interval:  146 QRS Duration: 76 QT Interval:  334 QTC Calculation: 445 R Axis:   20 Text Interpretation: Sinus tachycardia Cannot rule out Anterior infarct , age undetermined Abnormal ECG Confirmed by Benjiman Core [phone]) on 05/11/2021 3:32:05 PM  Radiology No results found.  Procedures Procedures   Medications Ordered in ED Medications - No data to display  ED Course  I have reviewed the triage vital signs and the nursing notes.  Pertinent labs & imaging results that were available during my care of the patient were reviewed by me and considered in my medical decision making (see chart for details).    MDM Rules/Calculators/A&P                           Patient with shortness of breath.  Dizziness.  Reported hypoxia but would not get hypoxic here with ambulation.  States speaking is what gives her most of the problem.  Will have follow-up  with pulmonology.  We will discharge him Final Clinical Impression(s) / ED Diagnoses Final diagnoses:  Dyspnea, unspecified type    Rx / DC Orders ED Discharge Orders     None        Benjiman Core, MD 05/19/21 0005

## 2021-06-15 NOTE — Progress Notes (Signed)
Cardiology Office Note:    Date:  06/16/2021   ID:  Dana Todd, DOB Jun 14, 1985, MRN [id number]  PCP:  Leilani Able, MD   [facility] Providers Cardiologist:  Christell Constant, MD     Referring MD: Karren Burly, MD   CC: dehydration Consulted for the evaluation of SOB at the behest of Leilani Able, MD  History of Present Illness:    Dana Todd is a 36 y.o. female with a hx of DOE with morbid obesity new heart murmur and question of tachyarrhythmia at last pulm eval 05/14/21, seen 06/16/21.  Patient notes that she is feeling sick after having surgery on Veteran's day (tubal ligation).  Notes SOB at rest. Notes that after ED visit found to have hypoxia on at home pulse ox (that worked for her partner)  Notes that she started drinking carbonated sodas and now has fast heart rates.  Notes waking up with hand numbness.  Because her job is very demanding she uses a urinal at home while working and notes decreased output.  No LE swelling.  No orthopnea.  Has had no chest pain, chest pressure, chest tightness, chest stinging.  No syncope or near syncope. Notes no palpitations or funny heart beats.      Past Medical History:  Diagnosis Date   Anxiety    Depression    DOE (dyspnea on exertion)    GERD (gastroesophageal reflux disease)    Retained intrauterine contraceptive device (IUD) 04/07/2021   Wears partial dentures    upper    Past Surgical History:  Procedure Laterality Date   HYSTEROSCOPY WITH RESECTOSCOPE N/A 04/17/2021   Procedure: HYSTEROSCOPY;  Surgeon: Maxie Better, MD;  Location: [facility];  Service: Gynecology;  Laterality: N/A;   NO PAST SURGERIES     TUBAL LIGATION Bilateral 04/17/2021   Procedure: BILATERAL TUBAL LIGATION;  Surgeon: Maxie Better, MD;  Location: [facility];  Service: Gynecology;  Laterality: Bilateral;    Current Medications: Current Meds  Medication Sig   cholecalciferol  (VITAMIN D) 1000 units tablet Take 1,000 Units by mouth daily.   hydrOXYzine (ATARAX) 25 MG tablet Take 1 tablet (25 mg total) by mouth every 8 (eight) hours as needed for anxiety.   ibuprofen (ADVIL) 800 MG tablet Take 1 tablet (800 mg total) by mouth every 8 (eight) hours as needed.   Multiple Vitamins-Minerals (MULTIVITAMIN WITH MINERALS) tablet Take 1 tablet by mouth daily. Mary ruth multivitamin daily with vitamin e   pantoprazole (PROTONIX) 40 MG tablet Take 1 tablet (40 mg total) by mouth daily.   [DISCONTINUED] pantoprazole sodium (PROTONIX) 40 mg/20 mL SUSP 40 mg daily.   [DISCONTINUED] pantoprazole sodium (PROTONIX) 40 mg Take 40 mg by mouth daily.     Allergies:   Other and Pork-derived products   Social History   Socioeconomic History   Marital status: Single    Spouse name: Not on file   Number of children: 0   Years of education: 15   Highest education level: Not on file  Occupational History   Occupation: Scientist, product/process development support    Comment: At home for [employer]  Tobacco Use   Smoking status: Never   Smokeless tobacco: Never  Vaping Use   Vaping Use: Never used  Substance and Sexual Activity   Alcohol use: Yes    Comment: occ   Drug use: No   Sexual activity: Not on file  Other Topics Concern   Not on file  Social History Narrative  Lives in [place], married with spouse , has one dog.  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 Grew up in [place] and was raised by mom. Has 2 brother and pt is middle child. 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    Social Determinants of Health   Financial Resource Strain: Not on file  Food Insecurity: Not on file  Transportation Needs: Not on file  Physical Activity: Not on file  Stress: Not on file  Social Connections: Not on file    Social: Working 70 hours a week, lives with partner, is a site Garment/textile technologist for [employer], vegetarian  Family History: The patient's family history includes  Anxiety disorder in her brother and mother; Bipolar disorder in her mother; Depression in her brother; Post-traumatic stress disorder in her brother; Schizophrenia in her maternal grandfather and mother. There is no history of Suicidality.  ROS:   Please see the history of present illness.     All other systems reviewed and are negative.  EKGs/Labs/Other Studies Reviewed:    The following studies were reviewed today:  EKG:  EKG is  ordered today.  The ekg ordered today demonstrates  06/16/21 SR rate 92 with LVH  CTPE: Date:05/08/21 Results: Normal Aortic and mPA size No PE Normal RV size No CAC   Recent Labs: 05/08/2021: TSH 1.210 05/11/2021: B Natriuretic Peptide 102.2 05/14/2021: ALT 10; BUN 5; Creatinine, Ser 0.81; Hemoglobin 11.5; Platelets 301; Potassium 4.5; Sodium 138  Recent Lipid Panel    Component Value Date/Time   CHOL 195 02/24/2017 0729   TRIG 93.0 02/24/2017 0729   HDL 59.30 02/24/2017 0729   CHOLHDL 3 02/24/2017 0729   VLDL 18.6 02/24/2017 0729   LDLCALC 118 (H) 02/24/2017 0729    Physical Exam:    VS:  BP 118/72    Pulse 92    Ht 5' (1.524 m)    Wt 105.9 kg    SpO2 98%    BMI 45.58 kg/m     Wt Readings from Last 3 Encounters:  06/16/21 105.9 kg  05/15/21 107.8 kg  05/14/21 104.3 kg    Gen: No distress, Morbid Obesity   Neck: No JVD  Ears: No  Pilar Plate Sign Cardiac: No Rubs or Gallops, no Murmur, no murmur with handgrip but distant heart sounds, regular rate +2 radial pulses Respiratory: Clear to auscultation bilaterally, normal effort, normal  respiratory rate GI: Soft, nontender, non-distended  MS: No  edema;  moves all extremities Integument: Skin feels warm Neuro:  At time of evaluation, alert and oriented to person/place/time/situation  Psych: Anxious affect, patient feels terrible   ASSESSMENT:    1. SOB (shortness of breath)   2. History of tachycardia   3. Hypomagnesemia   4. Polyuria   5. Morbid obesity ([name])   6. History of  dehydration    PLAN:    DOE With prior heart murmur and LVH on ECG - Will get Echo - normal BNP and with stress job; we discussed that a least a portion of this is related to the demands of her job  Polyuria and neuropathy and hypomagnesemia History of dehydration - will get BMP, A1C and Mg  Query of tachyarrhythmia with issues with home pulse ox associated with  - Ziopatch 14 day non live  Morbid Obesity GERD - awaiting establishment with new PC-MD - will refill pantoprazole X1 no refills until she can establish with primary - patient asks for bariatric referral  which is reasonable  CC Drs. Hunsucker and Reese - 3-4 months me or APP      Medication Adjustments/Labs and Tests Ordered: Current medicines are reviewed at length with the patient today.  Concerns regarding medicines are outlined above.  Orders Placed This Encounter  Procedures   Basic metabolic panel   Magnesium   Hemoglobin A1c   Amb Referral to Bariatric Surgery   ECHOCARDIOGRAM COMPLETE   Meds ordered this encounter  Medications   DISCONTD: pantoprazole sodium (PROTONIX) 40 mg    Sig: Take 40 mg by mouth daily.    Dispense:  30 packet    Refill:  0   pantoprazole (PROTONIX) 40 MG tablet    Sig: Take 1 tablet (40 mg total) by mouth daily.    Dispense:  30 tablet    Refill:  0    Patient Instructions  Medication Instructions:  Your physician recommends that you continue on your current medications as directed. Please refer to the Current Medication list given to you today.  1 month refill of pantoprazole  *If you need a refill on your cardiac medications before your next appointment, please call your pharmacy*   Lab Work: TODAY: BMP, HA1C, Mg If you have labs (blood work) drawn today and your tests are completely normal, you will receive your results only by: [place] (if you have MyChart) OR A paper copy in the mail If you have any lab test that is abnormal or we need to change your  treatment, we will call you to review the results.   Testing/Procedures: Your physician has requested that you wear a heart monitor.   Your physician has requested that you have an echocardiogram. Echocardiography is a painless test that uses sound waves to create images of your heart. It provides your doctor with information about the size and shape of your heart and how well your hearts chambers and valves are working. This procedure takes approximately one hour. There are no restrictions for this procedure.  Your physician has referred you to Bariatric Surgery.    Follow-Up: At [facility], you and your health needs are our priority.  As part of our continuing mission to provide you with exceptional heart care, we have created designated Provider Care Teams.  These Care Teams include your primary Cardiologist (physician) and Advanced Practice Providers (APPs -  Physician Assistants and Nurse Practitioners) who all work together to provide you with the care you need, when you need it.   Your next appointment:   3 - 4  month(s)  The format for your next appointment:   In Person  Provider:   Rudean Haskell, MD    Other Instructions  [place] Monitor Instructions  Your physician has requested you wear a ZIO patch monitor for 14 days.  This is a single patch monitor. [employer] supplies one patch monitor per enrollment. Additional stickers are not available. Please do not apply patch if you will be having a Nuclear Stress Test,  Echocardiogram, Cardiac CT, MRI, or Chest Xray during the period you would be wearing the  monitor. The patch cannot be worn during these tests. You cannot remove and re-apply the  ZIO XT patch monitor.  Your ZIO patch monitor will be mailed 3 day USPS to your address on file. It may take 3-5 days  to receive your monitor after you have been enrolled.  Once you have received your monitor, please review the enclosed instructions. Your monitor   has already  been registered assigning a specific monitor serial # to you.  Billing and Patient Assistance Program Information  We have supplied [employer] with any of your insurance information on file for billing purposes. [employer] offers a sliding scale Patient Assistance Program for patients that do not have  insurance, or whose insurance does not completely cover the cost of the ZIO monitor.  You must apply for the Patient Assistance Program to qualify for this discounted rate.  To apply, please call [employer] at [phone], select option 4, select option 2, ask to apply for  Patient Assistance Program. Theodore Demark will ask your household income, and how many people  are in your household. They will quote your out-of-pocket cost based on that information.  [employer] will also be able to set up a 24-month, interest-free payment plan if needed.  Applying the monitor   Shave hair from upper left chest.  Hold abrader disc by orange tab. Rub abrader in 40 strokes over the upper left chest as  indicated in your monitor instructions.  Clean area with 4 enclosed alcohol pads. Let dry.  Apply patch as indicated in monitor instructions. Patch will be placed under collarbone on left  side of chest with arrow pointing upward.  Rub patch adhesive wings for 2 minutes. Remove white label marked "1". Remove the white  label marked "2". Rub patch adhesive wings for 2 additional minutes.  While looking in a mirror, press and release button in center of patch. A small green light will  flash 3-4 times. This will be your only indicator that the monitor has been turned on.  Do not shower for the first 24 hours. You may shower after the first 24 hours.  Press the button if you feel a symptom. You will hear a small click. Record Date, Time and  Symptom in the Patient Logbook.  When you are ready to remove the patch, follow instructions on the last 2 pages of Patient  Logbook. Stick patch monitor onto the last page  of Patient Logbook.  Place Patient Logbook in the blue and white box. Use locking tab on box and tape box closed  securely. The blue and white box has prepaid postage on it. Please place it in the mailbox as  soon as possible. Your physician should have your test results approximately 7 days after the  monitor has been mailed back to [facility].  Call [place] at [phone] if you have questions regarding  your ZIO XT patch monitor. Call them immediately if you see an orange light blinking on your  monitor.  If your monitor falls off in less than 4 days, contact our Monitor department at [phone].  If your monitor becomes loose or falls off after 4 days call [employer] at [phone] for  suggestions on securing your monitor     Signed, Werner Lean, MD  06/16/2021 11:02 AM    [place]

## 2021-06-16 ENCOUNTER — Ambulatory Visit (INDEPENDENT_AMBULATORY_CARE_PROVIDER_SITE_OTHER): Payer: 59 | Admitting: Internal Medicine

## 2021-06-16 ENCOUNTER — Ambulatory Visit: Payer: 59

## 2021-06-16 ENCOUNTER — Other Ambulatory Visit: Payer: Self-pay | Admitting: Internal Medicine

## 2021-06-16 ENCOUNTER — Encounter: Payer: Self-pay | Admitting: Internal Medicine

## 2021-06-16 ENCOUNTER — Other Ambulatory Visit: Payer: Self-pay

## 2021-06-16 VITALS — BP 118/72 | HR 92 | Ht 60.0 in | Wt 233.4 lb

## 2021-06-16 DIAGNOSIS — R3589 Other polyuria: Secondary | ICD-10-CM | POA: Insufficient documentation

## 2021-06-16 DIAGNOSIS — Z87898 Personal history of other specified conditions: Secondary | ICD-10-CM

## 2021-06-16 DIAGNOSIS — R000 Tachycardia, unspecified: Secondary | ICD-10-CM

## 2021-06-16 DIAGNOSIS — R0602 Shortness of breath: Secondary | ICD-10-CM | POA: Diagnosis not present

## 2021-06-16 DIAGNOSIS — Z8639 Personal history of other endocrine, nutritional and metabolic disease: Secondary | ICD-10-CM | POA: Insufficient documentation

## 2021-06-16 MED ORDER — PANTOPRAZOLE SODIUM 40 MG PO TBEC: 40.0000 mg | DELAYED_RELEASE_TABLET | Freq: Every day | ORAL | 0 refills | Status: DC

## 2021-06-16 MED ORDER — PANTOPRAZOLE SODIUM 40 MG PO PACK: 40.0000 mg | PACK | Freq: Every day | ORAL | 0 refills | Status: DC

## 2021-06-16 NOTE — Patient Instructions (Addendum)
Medication Instructions:  Your physician recommends that you continue on your current medications as directed. Please refer to the Current Medication list given to you today.  1 month refill of pantoprazole  *If you need a refill on your cardiac medications before your next appointment, please call your pharmacy*   Lab Work: TODAY: BMP, HA1C, Mg If you have labs (blood work) drawn today and your tests are completely normal, you will receive your results only by: MyChart Message (if you have MyChart) OR A paper copy in the mail If you have any lab test that is abnormal or we need to change your treatment, we will call you to review the results.   Testing/Procedures: Your physician has requested that you wear a heart monitor.   Your physician has requested that you have an echocardiogram. Echocardiography is a painless test that uses sound waves to create images of your heart. It provides your doctor with information about the size and shape of your heart and how well your hearts chambers and valves are working. This procedure takes approximately one hour. There are no restrictions for this procedure.  Your physician has referred you to Bariatric Surgery.    Follow-Up: At [facility], you and your health needs are our priority.  As part of our continuing mission to provide you with exceptional heart care, we have created designated Provider Care Teams.  These Care Teams include your primary Cardiologist (physician) and Advanced Practice Providers (APPs -  Physician Assistants and Nurse Practitioners) who all work together to provide you with the care you need, when you need it.   Your next appointment:   3 - 4  month(s)  The format for your next appointment:   In Person  Provider:   Riley Lam, MD    Other Instructions  ZIO XT- Long Term Monitor Instructions  Your physician has requested you wear a ZIO patch monitor for 14 days.  This is a single patch monitor.  [employer] supplies one patch monitor per enrollment. Additional stickers are not available. Please do not apply patch if you will be having a Nuclear Stress Test,  Echocardiogram, Cardiac CT, MRI, or Chest Xray during the period you would be wearing the  monitor. The patch cannot be worn during these tests. You cannot remove and re-apply the  ZIO XT patch monitor.  Your ZIO patch monitor will be mailed 3 day USPS to your address on file. It may take 3-5 days  to receive your monitor after you have been enrolled.  Once you have received your monitor, please review the enclosed instructions. Your monitor  has already been registered assigning a specific monitor serial # to you.  Billing and Patient Assistance Program Information  We have supplied [employer] with any of your insurance information on file for billing purposes. [employer] offers a sliding scale Patient Assistance Program for patients that do not have  insurance, or whose insurance does not completely cover the cost of the ZIO monitor.  You must apply for the Patient Assistance Program to qualify for this discounted rate.  To apply, please call [employer] at [phone], select option 4, select option 2, ask to apply for  Patient Assistance Program. Meredeth Ide will ask your household income, and how many people  are in your household. They will quote your out-of-pocket cost based on that information.  [employer] will also be able to set up a 41-month, interest-free payment plan if needed.  Applying the monitor   Shave hair from upper left  chest.  Hold abrader disc by orange tab. Rub abrader in 40 strokes over the upper left chest as  indicated in your monitor instructions.  Clean area with 4 enclosed alcohol pads. Let dry.  Apply patch as indicated in monitor instructions. Patch will be placed under collarbone on left  side of chest with arrow pointing upward.  Rub patch adhesive wings for 2 minutes. Remove white label marked "1". Remove the  white  label marked "2". Rub patch adhesive wings for 2 additional minutes.  While looking in a mirror, press and release button in center of patch. A small green light will  flash 3-4 times. This will be your only indicator that the monitor has been turned on.  Do not shower for the first 24 hours. You may shower after the first 24 hours.  Press the button if you feel a symptom. You will hear a small click. Record Date, Time and  Symptom in the Patient Logbook.  When you are ready to remove the patch, follow instructions on the last 2 pages of Patient  Logbook. Stick patch monitor onto the last page of Patient Logbook.  Place Patient Logbook in the blue and white box. Use locking tab on box and tape box closed  securely. The blue and white box has prepaid postage on it. Please place it in the mailbox as  soon as possible. Your physician should have your test results approximately 7 days after the  monitor has been mailed back to [facility].  Call [facility] Customer Care at [phone] if you have questions regarding  your ZIO XT patch monitor. Call them immediately if you see an orange light blinking on your  monitor.  If your monitor falls off in less than 4 days, contact our Monitor department at [phone].  If your monitor becomes loose or falls off after 4 days call [employer] at [phone] for  suggestions on securing your monitor

## 2021-06-16 NOTE — Progress Notes (Unsigned)
Enrolled for [employer] to mail a ZIO XT long term holter monitor to the patients address on file.  

## 2021-06-17 LAB — HEMOGLOBIN A1C
Est. average glucose Bld gHb Est-mCnc: 120 mg/dL
Hgb A1c MFr Bld: 5.8 % — ABNORMAL HIGH (ref 4.8–5.6)

## 2021-06-17 LAB — MAGNESIUM: Magnesium: 2.1 mg/dL (ref 1.6–2.3)

## 2021-06-17 LAB — BASIC METABOLIC PANEL
BUN/Creatinine Ratio: 8 — ABNORMAL LOW (ref 9–23)
BUN: 6 mg/dL (ref 6–20)
CO2: 25 mmol/L (ref 20–29)
Calcium: 9.1 mg/dL (ref 8.7–10.2)
Chloride: 102 mmol/L (ref 96–106)
Creatinine, Ser: 0.72 mg/dL (ref 0.57–1.00)
Glucose: 91 mg/dL (ref 70–99)
Potassium: 4.6 mmol/L (ref 3.5–5.2)
Sodium: 140 mmol/L (ref 134–144)
eGFR: 113 mL/min/{1.73_m2} (ref >59)

## 2021-06-17 NOTE — Addendum Note (Signed)
Addended by: Winifred Olive on: 06/17/2021 04:14 PM   Modules accepted: Orders

## 2021-06-23 ENCOUNTER — Encounter: Payer: Self-pay | Admitting: Gastroenterology

## 2021-06-23 ENCOUNTER — Ambulatory Visit (INDEPENDENT_AMBULATORY_CARE_PROVIDER_SITE_OTHER): Payer: 59 | Admitting: Gastroenterology

## 2021-06-23 VITALS — BP 120/70 | HR 96 | Ht 60.0 in | Wt 235.0 lb

## 2021-06-23 DIAGNOSIS — K5909 Other constipation: Secondary | ICD-10-CM | POA: Diagnosis not present

## 2021-06-23 DIAGNOSIS — K219 Gastro-esophageal reflux disease without esophagitis: Secondary | ICD-10-CM

## 2021-06-23 NOTE — Progress Notes (Signed)
[facility] Consult Note:  History: Dana Todd 06/23/2021  Referring provider: Lin Landsman, MD  Reason for consult/chief complaint: Colon Cancer Screening (Patient hopes to have weight loss surgery, has been referred. She has no GI complains.)   Subjective  HPI: Dana Todd was referred to [place] after recent ED visit for chronic GERD and constipation.  She went to the ED a few times in early December with palpitations, then dyspnea and reported hypoxia at home (?  Checked on pulse oximeter or smart watch), and then dizziness.  She did not have documented hypoxia on the ED visits either with rest or ambulation, she then saw pulmonary in office consultation, no hypoxia and was apparently planned for a sleep study.  She saw cardiology and had a nonischemic EKG, and has an upcoming echocardiogram and ziopatch 2-week monitor. Dana Todd complains of frequent severe heartburn since childhood and has been on once daily pantoprazole for years with generally good control.  She still has some breakthrough symptoms, denies dysphagia, odynophagia, nausea or vomiting.  She also has chronic constipation for the last few years.  In describing it, she has a BM "6 or 7 times a week", but this is apparently a change from her previous pattern in that she may sometimes have to sit for a while before something passes and she might miss a day.  She feels bloated and pressure as if the need for BM, but does not always have one.  She tried MiraLAX, and although it did not make the stool loose, it made her feel dehydrated with dry mouth and throat and a feeling of generalized weakness the following morning.  She says that she battles chronic problems with dehydration, always feeling thirsty despite drinking a lot of water.  She saw a new PCP last week and discussed all of this with him, she is being tested for diabetes and apparently other metabolic problems of might cause dehydration.  Dana Todd is  getting all these evaluations done because she perceives this dehydration problem to have occurred because of significant weight gain in recent years.  This is a least part of her motivation to seek evaluation for bariatric surgery, although has not yet seen any bariatric clinic in consultation. She is also concerned because her mother had chronic lower digestive problems that she feels went unattended and eventually led to a serious issue.  ROS:  Review of Systems  Constitutional:  Positive for fatigue. Negative for appetite change and unexpected weight change.  HENT:  Negative for mouth sores and voice change.   Eyes:  Negative for pain and redness.  Respiratory:  Positive for shortness of breath. Negative for cough.   Cardiovascular:  Negative for chest pain and palpitations.  Genitourinary:  Negative for dysuria and hematuria.  Musculoskeletal:  Negative for arthralgias and myalgias.  Skin:  Negative for pallor and rash.  Neurological:  Negative for weakness and headaches.  Hematological:  Negative for adenopathy.  Denies rectal bleeding  Past Medical History: Past Medical History:  Diagnosis Date   Anxiety    Depression    DOE (dyspnea on exertion)    GERD (gastroesophageal reflux disease)    Retained intrauterine contraceptive device (IUD) 04/07/2021   Wears partial dentures    upper     Past Surgical History: Past Surgical History:  Procedure Laterality Date   HYSTEROSCOPY WITH RESECTOSCOPE N/A 04/17/2021   Procedure: HYSTEROSCOPY;  Surgeon: Servando Salina, MD;  Location: [facility];  Service: Gynecology;  Laterality: N/A;  TUBAL LIGATION Bilateral 04/17/2021   Procedure: BILATERAL TUBAL LIGATION;  Surgeon: Servando Salina, MD;  Location: [facility];  Service: Gynecology;  Laterality: Bilateral;     Family History: Family History  Problem Relation Age of Onset   Bipolar disorder Mother    Schizophrenia Mother    Anxiety  disorder Mother    Diverticulitis Mother    COPD Father    Other Father        murdered   Anxiety disorder Brother    Depression Brother    Post-traumatic stress disorder Brother    Schizophrenia Maternal Grandfather    Suicidality Neg Hx    Colon cancer Neg Hx    Esophageal cancer Neg Hx     Social History: Social History   Socioeconomic History   Marital status: Single    Spouse name: Not on file   Number of children: 0   Years of education: 15   Highest education level: Not on file  Occupational History   Occupation: Hotel manager support    Comment: At home for [employer]  Tobacco Use   Smoking status: Never   Smokeless tobacco: Never  Vaping Use   Vaping Use: Never used  Substance and Sexual Activity   Alcohol use: Yes    Comment: occ   Drug use: No   Sexual activity: Not on file  Other Topics Concern   Not on file  Social History Narrative   Lives in [place], married with spouse , has one dog.  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 Grew up in [place] and was raised by mom. Has 2 brother and pt is middle child. 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    Social Determinants of Health   Financial Resource Strain: Not on file  Food Insecurity: Not on file  Transportation Needs: Not on file  Physical Activity: Not on file  Stress: Not on file  Social Connections: Not on file    Allergies: Allergies  Allergen Reactions   Other     Gelatin stomach pain n/v   Pork-Derived Products     Stomach pain n/v    Outpatient Meds: Current Outpatient Medications  Medication Sig Dispense Refill   cholecalciferol (VITAMIN D) 1000 units tablet Take 1,000 Units by mouth daily.     hydrOXYzine (ATARAX) 25 MG tablet Take 1 tablet (25 mg total) by mouth every 8 (eight) hours as needed for anxiety. 30 tablet 0   ibuprofen (ADVIL) 800 MG tablet Take 1 tablet (800 mg total) by mouth every 8 (eight) hours as needed. 30 tablet 11   Multiple  Vitamins-Minerals (MULTIVITAMIN WITH MINERALS) tablet Take 1 tablet by mouth daily. Mary ruth multivitamin daily with vitamin e     pantoprazole (PROTONIX) 40 MG tablet Take 1 tablet (40 mg total) by mouth daily. 30 tablet 0   No current facility-administered medications for this visit.      ___________________________________________________________________ Objective   Exam:  BP 120/70    Pulse 96    Ht 5' (1.524 m)    Wt 235 lb (106.6 kg)    BMI 45.90 kg/m  Wt Readings from Last 3 Encounters:  06/23/21 235 lb (106.6 kg)  06/16/21 233 lb 6.4 oz (105.9 kg)  05/15/21 237 lb 9.6 oz (107.8 kg)   Her significant other Randall Hiss is here for the entire visit. General: Well-appearing, pleasant and conversational, gets on exam table without difficulty, normal vocal quality, breathing  comfortably at rest. Eyes: sclera anicteric, no redness ENT: oral mucosa moist without lesions, no cervical or supraclavicular lymphadenopathy CV: RRR without murmur, S1/S2, no JVD, no peripheral edema Resp: clear to auscultation bilaterally, normal RR and effort noted GI: soft, no tenderness, with active bowel sounds. No guarding or palpable organomegaly noted, limited by body habitus Skin; warm and dry, no rash or jaundice noted Neuro: awake, alert and oriented x 3. Normal gross motor function and fluent speech  Labs:  CBC Latest Ref Rng & Units 05/14/2021 05/11/2021 05/08/2021  WBC 4.0 - 10.5 K/uL 10.2 11.0(H) -  Hemoglobin 12.0 - 15.0 g/dL 11.5(L) 11.9(L) 13.3  Hematocrit 36.0 - 46.0 % 35.8(L) 37.5 39.0  Platelets 150 - 400 K/uL 301 314 -  Normal MCV and normal RDW  CMP Latest Ref Rng & Units 06/16/2021 05/14/2021 05/11/2021  Glucose 70 - 99 mg/dL 91 104(H) 100(H)  BUN 6 - 20 mg/dL 6 5(L) <5(L)  Creatinine 0.57 - 1.00 mg/dL 0.72 0.81 0.77  Sodium 134 - 144 mmol/L 140 138 138  Potassium 3.5 - 5.2 mmol/L 4.6 4.5 4.0  Chloride 96 - 106 mmol/L 102 105 107  CO2 20 - 29 mmol/L 25 24 25   Calcium 8.7 - 10.2 mg/dL  9.1 9.2 9.2  Total Protein 6.5 - 8.1 g/dL - 7.5 -  Total Bilirubin 0.3 - 1.2 mg/dL - 1.1 -  Alkaline Phos 38 - 126 U/L - 73 -  AST 15 - 41 U/L - 24 -  ALT 0 - 44 U/L - 10 -   Recent cardiology office note reviewed including description of EKG  Assessment: Encounter Diagnoses  Name Primary?   Gastroesophageal reflux disease, unspecified whether esophagitis present Yes   Chronic constipation     Longstanding GERD manifest as heartburn generally under good control with once daily PPI. We discussed the limitation of acid suppression therapy, breadth of diet and lifestyle changes required for reflux control, the possibility of reflux related complications such as esophagitis, stricture or Barrett's esophagus.  Other anatomic considerations such as gastric outlet obstruction or hiatal hernia may contribute to reflux.  Change in bowel habits described as constipation for last few years.  She does not go longer than a day without a BM, and that history and timing makes it seem unlikely to be something obstructive.  She is concerned about it in the context of her mother's history and her plans for potential bariatric surgery and requesting further work-up. If MiraLAX (even without causing loose stool) made her feel dehydrated, then a medicine like Linzess or Amitiza is not likely to agree with her. Unknown whether her reported chronic dehydration problem is contributing.  Plan:  I offered her endoscopic testing with EGD and colonoscopy after final results of her upcoming echocardiogram and 2-week heart monitor.  She will notify me when those are finished and she received reports from cardiology.  If favorable, she will be contacted with scheduling and instructions for endoscopic procedures.  (45 minutes total time, inclusive of extensive ED and other consultant records with data reviewed, face-to-face evaluation with patient and subsequent documentation and care coordination  Nelida Meuse  III  CC: Referring provider noted above

## 2021-06-23 NOTE — Patient Instructions (Signed)
If you are age 34 or older, your body mass index should be between 23-30. Your Body mass index is 45.9 kg/m. If this is out of the aforementioned range listed, please consider follow up with your Primary Care Provider.  If you are age 59 or younger, your body mass index should be between 19-25. Your Body mass index is 45.9 kg/m. If this is out of the aformentioned range listed, please consider follow up with your Primary Care Provider.   ________________________________________________________  The [facility] providers would like to encourage you to use [facility] to communicate with providers for non-urgent requests or questions.  Due to long hold times on the telephone, sending your provider a message by [facility] may be a faster and more efficient way to get a response.  Please allow 48 business hours for a response.  Please remember that this is for non-urgent requests.  _______________________________________________________  We will be in touch after we hear from cardiology.   It was a pleasure to see you today!  Thank you for trusting me with your gastrointestinal care!

## 2021-06-25 ENCOUNTER — Other Ambulatory Visit: Payer: Self-pay

## 2021-06-25 ENCOUNTER — Ambulatory Visit (HOSPITAL_COMMUNITY): Payer: 59 | Attending: Cardiology

## 2021-06-25 DIAGNOSIS — R0602 Shortness of breath: Secondary | ICD-10-CM | POA: Insufficient documentation

## 2021-06-25 LAB — ECHOCARDIOGRAM COMPLETE
Area-P 1/2: 7.02 cm2
S' Lateral: 2.8 cm

## 2021-07-07 ENCOUNTER — Ambulatory Visit: Payer: 59 | Admitting: Pulmonary Disease

## 2021-07-08 ENCOUNTER — Other Ambulatory Visit: Payer: Self-pay | Admitting: Internal Medicine

## 2021-08-11 ENCOUNTER — Encounter: Payer: Self-pay | Admitting: Pulmonary Disease

## 2021-10-22 ENCOUNTER — Ambulatory Visit: Payer: 59 | Admitting: Internal Medicine

## 2021-12-16 ENCOUNTER — Encounter (HOSPITAL_COMMUNITY): Payer: Self-pay

## 2021-12-16 ENCOUNTER — Other Ambulatory Visit: Payer: Self-pay

## 2021-12-16 ENCOUNTER — Emergency Department (HOSPITAL_COMMUNITY)
Admission: EM | Admit: 2021-12-16 | Discharge: 2021-12-16 | Discharge status: Against Medical Advice | Payer: 59 | Attending: Emergency Medicine | Admitting: Emergency Medicine

## 2021-12-16 DIAGNOSIS — Z5321 Procedure and treatment not carried out due to patient leaving prior to being seen by health care provider: Secondary | ICD-10-CM | POA: Diagnosis not present

## 2021-12-16 DIAGNOSIS — R002 Palpitations: Secondary | ICD-10-CM | POA: Diagnosis not present

## 2021-12-16 DIAGNOSIS — I10 Essential (primary) hypertension: Secondary | ICD-10-CM | POA: Insufficient documentation

## 2021-12-16 DIAGNOSIS — R0602 Shortness of breath: Secondary | ICD-10-CM | POA: Insufficient documentation

## 2021-12-16 DIAGNOSIS — R030 Elevated blood-pressure reading, without diagnosis of hypertension: Secondary | ICD-10-CM | POA: Diagnosis present

## 2021-12-16 NOTE — ED Provider Triage Note (Signed)
Emergency Medicine Provider Triage Evaluation Note  Rosealee Recinos , a 36 y.o. female  was evaluated in triage.  Pt complains of hypertension.  Patient reports that she checked her blood pressure at home and it was elevated at 150/102.  Patient reports that she called her PCP and was advised to come to the emergency department for further evaluation.  Patient denies any other associated symptoms.  Review of Systems  Positive:  Negative: Numbness, weakness, facial asymmetry, dysarthria, chest pain, shortness of breath, headache, visual disturbance  Physical Exam  BP 128/90 (BP Location: Right Arm)   Pulse (!) 102   Temp 98.8 F (37.1 C) (Oral)   Resp 16   SpO2 100%  Gen:   Awake, no distress   Resp:  Normal effort  MSK:   Moves extremities without difficulty  Other:    Medical Decision Making  Medically screening exam initiated at 5:48 PM.  Appropriate orders placed.  Genene Kilman was informed that the remainder of the evaluation will be completed by another provider, this initial triage assessment does not replace that evaluation, and the importance of remaining in the ED until their evaluation is complete.  Patient refused physical exam, refused any lab work.  Patient was advised that we cannot fully determine what may have caused her blood pressure being elevated earlier today.  Patient understands this and agrees to leave AGAINST MEDICAL ADVICE.   Haskel Schroeder, [place] 12/16/21 1749

## 2021-12-16 NOTE — ED Triage Notes (Signed)
Complains of heart palpitations and sob that is recurrent.  Reports her HR 160s here in triage is 102.

## 2022-03-15 ENCOUNTER — Ambulatory Visit (INDEPENDENT_AMBULATORY_CARE_PROVIDER_SITE_OTHER): Payer: 59 | Admitting: Cardiovascular Disease

## 2022-03-15 ENCOUNTER — Encounter (HOSPITAL_BASED_OUTPATIENT_CLINIC_OR_DEPARTMENT_OTHER): Payer: Self-pay | Admitting: *Deleted

## 2022-03-15 ENCOUNTER — Encounter (HOSPITAL_BASED_OUTPATIENT_CLINIC_OR_DEPARTMENT_OTHER): Payer: Self-pay | Admitting: Cardiovascular Disease

## 2022-03-15 ENCOUNTER — Telehealth: Payer: Self-pay | Admitting: *Deleted

## 2022-03-15 DIAGNOSIS — I10 Essential (primary) hypertension: Secondary | ICD-10-CM

## 2022-03-15 DIAGNOSIS — R400 Somnolence: Secondary | ICD-10-CM | POA: Diagnosis not present

## 2022-03-15 DIAGNOSIS — I493 Ventricular premature depolarization: Secondary | ICD-10-CM

## 2022-03-15 DIAGNOSIS — R0683 Snoring: Secondary | ICD-10-CM | POA: Diagnosis not present

## 2022-03-15 HISTORY — DX: Essential (primary) hypertension: I10

## 2022-03-15 HISTORY — DX: Ventricular premature depolarization: I49.3

## 2022-03-15 NOTE — Assessment & Plan Note (Addendum)
BP is uncontrolled.  She notes that several lifestyle factors are contributing.  She has had a significant amount of weight gain since the passing of her father.  She is eager to start working on weight loss.  We discussed the PREP program at the [facility] and she would like to participate.  Her goal is at least 150 minutes of exercise weekly.  We also discussed limiting her sodium intake.  She would like to be referred to the healthy weight and wellness clinic to further assist with weight loss.  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  She is actively working to change her work situation.  We will check a TSH and sleep study.  Continue HCTZ.  Her blood pressure goal is less than 130/80.  Given that she is of childbearing age we will need to keep this in consideration should she need other antihypertensives in the future.

## 2022-03-15 NOTE — Telephone Encounter (Signed)
Staff message sent to Dana Todd ok to activate itamar device. 

## 2022-03-15 NOTE — Assessment & Plan Note (Signed)
She has struggled with weight gain.  Referral to prep and healthy weight and wellness as above.  Increase exercise to at least 150 minutes weekly.  We discussed the need to increase her protein intake and eat more frequently.

## 2022-03-15 NOTE — Patient Instructions (Signed)
Medication Instructions:  Your physician recommends that you continue on your current medications as directed. Please refer to the Current Medication list given to you today.    Labwork: LP/CMET/CBC/TSH TODAY    Testing/Procedures: ITAMAR HOME SLEEP STUDY    Follow-Up: 06/14/2022 AT 10:30 AM WITH DR Oval Linsey    You will receive a phone call from the PREP exercise and nutrition program to schedule an initial assessment.  Referrals:  You have been referred to HEALTHY WEIGHT AND WELLNESS IF YOU DO NOT HEAR FROM THE OFFICE IN 2 WEEKS YOU CAN CALL THEM DIRECTLY   Special Instructions:   MONITOR AND LOG YOUR BLOOD PRESSURE DAILY, LOG IN BOOK PROVIDED. BRING BOOK AND MACHINE TO FOLLOW UP   WatchPAT?  Is a FDA cleared portable home sleep study test that uses a watch and 3 points of contact to monitor 7 different channels, including your heart rate, oxygen saturations, body position, snoring, and chest motion.  The study is easy to use from the comfort of your own home and accurately detect sleep apnea.  Before bed, you attach the chest sensor, attached the sleep apnea bracelet to your nondominant hand, and attach the finger probe.  After the study, the raw data is downloaded from the watch and scored for apnea events.   For more information: [email or web address]  Patient Testing Instructions:  Do not put battery into the device until bedtime when you are ready to begin the test. Please call the support number if you need assistance after following the instructions below: 24 hour support line- [phone] or ITAMAR support at [phone] (option 2)  Download the [employer] AmericanWatchPAT One" app through the google play store or App Store  Be sure to turn on or enable access to bluetooth in settlings on your smartphone/ device  Make sure no other bluetooth devices are on and within the vicinity of your smartphone/ device and WatchPAT watch during testing.  Make sure to leave your  smart phone/ device plugged in and charging all night.  When ready for bed:  Follow the instructions step by step in the WatchPAT One App to activate the testing device. For additional instructions, including video instruction, visit the WatchPAT One video on Youtube. You can search for [place] One within Youtube (video is 4 minutes and 18 seconds) or enter: https://youtube/watch?[email or web address]_vbiwxE Please note: You will be prompted to enter a Pin to connect via bluetooth when starting the test. The PIN will be assigned to you when you receive the test.  The device is disposable, but it recommended that you retain the device until you receive a call letting you know the study has been received and the results have been interpreted.  We will let you know if the study did not transmit to [place] properly after the test is completed. You do not need to call us to confirm the receipt of the test.  Please complete the test within 48 hours of receiving PIN.   Frequently Asked Questions:  What is Watch [place] one?  A single use fully disposable home sleep apnea testing device and will not need to be returned after completion.  What are the requirements to use WatchPAT one?  The be able to have a successful watchpat one sleep study, you should have your Watch pat one device, your smart phone, watch pat one app, your PIN number and Internet access What type of phone do I need?  You should have a smart phone that uses Android 5.1 and above or any  Iphone with IOS 10 and above How can I download the WatchPAT one app?  Based on your device type search for WatchPAT one app either in google play for android devices or APP store for Iphone's Where will I get my PIN for the study?  Your PIN will be provided by your physician's office. It is used for authentication and if you lose/forget your PIN, please reach out to your providers office.  I do not have Internet at home. Can I do WatchPAT one study?  WatchPAT One needs  Internet connection throughout the night to be able to transmit the sleep data. You can use your home/local internet or your cellular's data package. However, it is always recommended to use home/local Internet. It is estimated that between 20MB-30MB will be used with each study.However, the application will be looking for space in the phone to start the study.  What happens if I lose internet or bluetooth connection?  During the internet disconnection, your phone will not be able to transmit the sleep data. All the data, will be stored in your phone. As soon as the internet connection is back on, the phone will being sending the sleep data. During the bluetooth disconnection, WatchPAT one will not be able to to send the sleep data to your phone. Data will be kept in the [facility] one until two devices have bluetooth connection back on. As soon as the connection is back on, WatchPAT one will send the sleep data to the phone.  How long do I need to wear the WatchPAT one?  After you start the study, you should wear the device at least 6 hours.  How far should I keep my phone from the device?  During the night, your phone should be within 15 feet.  What happens if I leave the room for restroom or other reasons?  Leaving the room for any reason will not cause any problem. As soon as your get back to the room, both devices will reconnect and will continue to send the sleep data. Can I use my phone during the sleep study?  Yes, you can use your phone as usual during the study. But it is recommended to put your watchpat one on when you are ready to go to bed.  How will I get my study results?  A soon as you completed your study, your sleep data will be sent to the provider. They will then share the results with you when they are ready.

## 2022-03-15 NOTE — Progress Notes (Signed)
Advanced Hypertension Clinic Initial Assessment:    Date:  03/15/2022   ID:  Dana Todd, DOB 06-Jul-1985, MRN [id number]  PCP:  Leilani Able, MD  Cardiologist:  Christell Constant, MD  Nephrologist:  Referring MD: Maxie Better, MD   CC: Hypertension  History of Present Illness:    Dana Todd is a 36 y.o. female with a hx of hypertension, GERD, anxiety, and depression, here to establish care in the Advanced Hypertension Clinic. She last saw Dr. Izora Ribas 06/2021 for a murmur and shortness of breath. She had an Echo that revealed 60-65% and normal diastolic function. Blood pressure at that time was 118/72. She reported palpitations and a ZIO monitor was ordered but not completed. She saw Dr. Cherly Hensen 12/2021 and her blood pressure was 138/100 on HCTZ, so she was referred to the [facility] Hypertension Clinic.  Today, she is accompanied by her fiancee. She has been under a lot of stress since the unexpected loss of her father. Initially she was diagnosed with hypertension when she was 36 yo, which she attributes to significant work stress. Lately her blood pressures have been fluctuating low and high at home. 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 This is very stressful as she often deals with irate clients. She is also concerned about gaining weight. A few years ago she was 130 lbs and is currently 237 lbs. She attributes her weight gain to inactivity as she typically doesn't eat very much.  In the past 2 weeks she worked 121 hours; she does not have much time for formal exercise but does try to go walking. On Saturday, she went walking and noticed her heart rate rose to the 170s. However, after resting for 5 minutes her heart rate was still as high as 125 bpm. After her surgery in 04/2021 she began to have heart palpitations. She "feels her heart drop" all the time. This may last for up to 20 minutes. One guaranteed trigger of her heart drop sensations is trying to  drive on the highway. She is forced to pull over and her fiancee will drive. Due to her palpitations, she has tried cutting out coffee and sodas since 06/2021. She has also cut back significantly on alcohol consumption, which is now rare. Additionally she believes her palpitations may be related to her anxiety. She states that her anxiety medication is not working well. Regarding her diet she may order out occasionally, but she usually microwaves frozen foods. Normally she monitors her sodium intake.  Current supplements include organic magnesium, and vitamin D. She also drinks Pedialyte. For 3 days a month during her menses she usually takes ibuprofen. Her fiancee reports that at night she may sound like she is breathing heavily, and she snores during REM sleep. She denies any chest pain, shortness of breath, or peripheral edema. No lightheadedness, headaches, syncope, orthopnea, or PND.   Past Medical History:  Diagnosis Date   Anxiety    Depression    DOE (dyspnea on exertion)    Essential hypertension 03/15/2022   GERD (gastroesophageal reflux disease)    PVC's (premature ventricular contractions) 03/15/2022   Retained intrauterine contraceptive device (IUD) 04/07/2021   Wears partial dentures    upper    Past Surgical History:  Procedure Laterality Date   HYSTEROSCOPY WITH RESECTOSCOPE N/A 04/17/2021   Procedure: HYSTEROSCOPY;  Surgeon: Maxie Better, MD;  Location: [facility];  Service: Gynecology;  Laterality: N/A;   TUBAL LIGATION Bilateral 04/17/2021   Procedure: BILATERAL  TUBAL LIGATION;  Surgeon: Servando Salina, MD;  Location: [facility];  Service: Gynecology;  Laterality: Bilateral;    Current Medications: Current Meds  Medication Sig   cholecalciferol (VITAMIN D) 1000 units tablet Take 1,000 Units by mouth daily.   hydrochlorothiazide (HYDRODIURIL) 25 MG tablet Take 25 mg by mouth daily.   hydrOXYzine (ATARAX) 25 MG tablet Take 1  tablet (25 mg total) by mouth every 8 (eight) hours as needed for anxiety.   ibuprofen (ADVIL) 800 MG tablet Take 1 tablet (800 mg total) by mouth every 8 (eight) hours as needed.   Multiple Vitamins-Minerals (MULTIVITAMIN WITH MINERALS) tablet Take 1 tablet by mouth daily. Mary ruth multivitamin daily with vitamin e   pantoprazole (PROTONIX) 40 MG tablet Take 1 tablet (40 mg total) by mouth daily.     Allergies:   Gelatin, Other, and Pork-derived products   Social History   Socioeconomic History   Marital status: Single    Spouse name: Not on file   Number of children: 0   Years of education: 15   Highest education level: Not on file  Occupational History   Occupation: Hotel manager support    Comment: At home for [employer]  Tobacco Use   Smoking status: Never   Smokeless tobacco: Never  Vaping Use   Vaping Use: Never used  Substance and Sexual Activity   Alcohol use: Yes    Comment: occ   Drug use: No   Sexual activity: Not on file  Other Topics Concern   Not on file  Social History Narrative   Lives in [place], married with spouse , has one dog.  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 Grew up in [place] and was raised by mom. Has 2 brother and pt is middle child. 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    Social Determinants of Health   Financial Resource Strain: Low Risk  (03/15/2022)   Overall Financial Resource Strain (CARDIA)    Difficulty of Paying Living Expenses: Not hard at all  Food Insecurity: No Food Insecurity (03/15/2022)   Hunger Vital Sign    Worried About Running Out of Food in the Last Year: Never true    Ran Out of Food in the Last Year: Never true  Transportation Needs: No Transportation Needs (03/15/2022)   PRAPARE - Hydrologist (Medical): No    Lack of Transportation (Non-Medical): No  Physical Activity: Inactive (03/15/2022)   Exercise Vital Sign    Days of Exercise per Week: 0 days     Minutes of Exercise per Session: 0 min  Stress: Not on file  Social Connections: Not on file     Family History: The patient's family history includes Anxiety disorder in her brother and mother; Bipolar disorder in her mother; COPD in her father; Depression in her brother; Diverticulitis in her mother; Hypertension in her mother; Other in her father; Post-traumatic stress disorder in her brother; Schizophrenia in her maternal grandfather and mother. There is no history of Suicidality, Colon cancer, or Esophageal cancer.  ROS:   Please see the history of present illness.    (+) Stress/Anxiety (+) Palpitations (+) Snoring All other systems reviewed and are negative.  EKGs/Labs/Other Studies Reviewed:    Echocardiogram  06/25/2021:  1. Left ventricular ejection fraction, by estimation, is 60 to 65%. The  left ventricle has normal function. The left ventricle has no regional  wall motion abnormalities.  Left ventricular diastolic parameters were  normal.   2. Right ventricular systolic function is normal. The right ventricular  size is normal.   3. The mitral valve is normal in structure. No evidence of mitral valve  regurgitation. No evidence of mitral stenosis.   4. The aortic valve is normal in structure. Aortic valve regurgitation is  not visualized. No aortic stenosis is present.   5. The inferior vena cava is normal in size with greater than 50%  respiratory variability, suggesting right atrial pressure of 3 mmHg.   CTA Chest  05/08/2021: IMPRESSION: 1. No segmental or larger pulmonary embolus. 2. No acute thoracic abnormality.  EKG:  EKG is personally reviewed. 03/15/2022: Sinus rhythm. Rate 97 bpm. PVCs.    Recent Labs: 05/08/2021: TSH 1.210 05/11/2021: B Natriuretic Peptide 102.2 05/14/2021: ALT 10; Hemoglobin 11.5; Platelets 301 06/16/2021: BUN 6; Creatinine, Ser 0.72; Magnesium 2.1; Potassium 4.6; Sodium 140   Recent Lipid Panel    Component Value Date/Time   CHOL  195 02/24/2017 0729   TRIG 93.0 02/24/2017 0729   HDL 59.30 02/24/2017 0729   CHOLHDL 3 02/24/2017 0729   VLDL 18.6 02/24/2017 0729   LDLCALC 118 (H) 02/24/2017 0729    Physical Exam:    VS:  BP 112/86 (BP Location: Right Arm, Patient Position: Sitting, Cuff Size: Large)   Pulse 97   Ht 5' (1.524 m)   Wt 237 lb 8 oz (107.7 kg)   BMI 46.38 kg/m  , BMI Body mass index is 46.38 kg/m. GENERAL:  Well appearing HEENT: Pupils equal round and reactive, fundi not visualized, oral mucosa unremarkable NECK:  No jugular venous distention, waveform within normal limits, carotid upstroke brisk and symmetric, no bruits, no thyromegaly LUNGS:  Clear to auscultation bilaterally HEART:  RRR.  PMI not displaced or sustained,S1 and S2 within normal limits, no S3, no S4, no clicks, no rubs, no murmurs ABD:  Flat, positive bowel sounds normal in frequency in pitch, no bruits, no rebound, no guarding, no midline pulsatile mass, no hepatomegaly, no splenomegaly EXT:  2 plus pulses throughout, no edema, no cyanosis no clubbing SKIN:  No rashes no nodules NEURO:  Cranial nerves II through XII grossly intact, motor grossly intact throughout PSYCH:  Cognitively intact, oriented to person place and time   ASSESSMENT/PLAN:    Essential hypertension BP is uncontrolled.  She notes that several lifestyle factors are contributing.  She has had a significant amount of weight gain since the passing of her father.  She is eager to start working on weight loss.  We discussed the PREP program at the [facility] and she would like to participate.  Her goal is at least 150 minutes of exercise weekly.  We also discussed limiting her sodium intake.  She would like to be referred to the healthy weight and wellness clinic to further assist with weight loss.  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  She is actively working to change her work situation.  We will check a TSH and sleep study.  Continue  HCTZ.  Her blood pressure goal is less than 130/80.  Given that she is of childbearing age we will need to keep this in consideration should she need other antihypertensives in the future.  PVC's (premature ventricular contractions) She has frequent sees.  Stress is certainly contributing.  Repeat CMP, TSH, and CBC.  She does have a history of anemia.  We did discuss switching her hydrochlorothiazide to a  beta-blocker.  Given that overall her HCTZ is controlling her blood pressure and she does not want to be on medication long-term, she declined.  Checking for sleep apnea.  Work on Optician, dispensing as above.  Increase exercise as above.  She had an echo this year that showed normal systolic function and she has no evidence of heart failure on exam.  No need to repeat at this time.  Morbid obesity (HCC) She has struggled with weight gain.  Referral to prep and healthy weight and wellness as above.  Increase exercise to at least 150 minutes weekly.  We discussed the need to increase her protein intake and eat more frequently.   Screening for Secondary Hypertension:     03/15/2022   10:45 AM  Causes  Drugs/Herbals Screened     - Comments No caffeine.  Rare EtOH.  No tobacco.  Ibuprofen 3 days per month.  Sleep Apnea Screened     - Comments check sleep study    Relevant Labs/Studies:    Latest Ref Rng & Units 06/16/2021   11:05 AM 05/14/2021    6:26 AM 05/11/2021   11:25 AM  Basic Labs  Sodium 134 - 144 mmol/L 140  138  138   Potassium 3.5 - 5.2 mmol/L 4.6  4.5  4.0   Creatinine 0.57 - 1.00 mg/dL 8.25  0.53  9.76        Latest Ref Rng & Units 05/08/2021    2:54 PM 02/24/2017    7:29 AM  Thyroid   TSH 0.350 - 4.500 uIU/mL 1.210  3.68      Disposition:    FU with Lennard Capek C. Duke Salvia, MD, [facility] in 3-4 months.  Medication Adjustments/Labs and Tests Ordered: Current medicines are reviewed at length with the patient today.  Concerns regarding medicines are outlined above.   Orders Placed  This Encounter  Procedures   CBC with Differential/Platelet   TSH   Lipid panel   Comprehensive metabolic panel   Amb Referral To Provider Referral Exercise Program (P.R.E.P)   Ambulatory referral to [facility]   EKG 12-Lead   [place] Sleep Study   No orders of the defined types were placed in this encounter.  I,Mathew Stumpf,acting as a Neurosurgeon for Chilton Si, MD.,have documented all relevant documentation on the behalf of Chilton Si, MD,as directed by  Chilton Si, MD while in the presence of Chilton Si, MD.  I, Rollin Kotowski C. Duke Salvia, MD have reviewed all documentation for this visit.  The documentation of the exam, diagnosis, procedures, and orders on 03/15/2022 are all accurate and complete.   Signed, Chilton Si, MD  03/15/2022 11:16 AM    [facility]

## 2022-03-15 NOTE — Assessment & Plan Note (Signed)
She has frequent sees.  Stress is certainly contributing.  Repeat CMP, TSH, and CBC.  She does have a history of anemia.  We did discuss switching her hydrochlorothiazide to a beta-blocker.  Given that overall her HCTZ is controlling her blood pressure and she does not want to be on medication long-term, she declined.  Checking for sleep apnea.  Work on Child psychotherapist as above.  Increase exercise as above.  She had an echo this year that showed normal systolic function and she has no evidence of heart failure on exam.  No need to repeat at this time.

## 2022-03-17 LAB — COMPREHENSIVE METABOLIC PANEL
ALT: 11 IU/L (ref 0–32)
AST: 17 IU/L (ref 0–40)
Albumin/Globulin Ratio: 1.2 (ref 1.2–2.2)
Albumin: 4.3 g/dL (ref 3.9–4.9)
Alkaline Phosphatase: 100 IU/L (ref 44–121)
BUN/Creatinine Ratio: 5 — ABNORMAL LOW (ref 9–23)
BUN: 4 mg/dL — ABNORMAL LOW (ref 6–20)
Bilirubin Total: 0.3 mg/dL (ref 0.0–1.2)
CO2: 24 mmol/L (ref 20–29)
Calcium: 9.5 mg/dL (ref 8.7–10.2)
Chloride: 98 mmol/L (ref 96–106)
Creatinine, Ser: 0.77 mg/dL (ref 0.57–1.00)
Globulin, Total: 3.5 g/dL (ref 1.5–4.5)
Glucose: 109 mg/dL — ABNORMAL HIGH (ref 70–99)
Potassium: 3.8 mmol/L (ref 3.5–5.2)
Sodium: 138 mmol/L (ref 134–144)
Total Protein: 7.8 g/dL (ref 6.0–8.5)
eGFR: 104 mL/min/{1.73_m2} (ref >59)

## 2022-03-17 LAB — CBC WITH DIFFERENTIAL/PLATELET
Basophils Absolute: 0.1 10*3/uL (ref 0.0–0.2)
Basos: 1 %
EOS (ABSOLUTE): 0.2 10*3/uL (ref 0.0–0.4)
Eos: 2 %
Hematocrit: 36.8 % (ref 34.0–46.6)
Hemoglobin: 12.2 g/dL (ref 11.1–15.9)
Immature Grans (Abs): 0 10*3/uL (ref 0.0–0.1)
Immature Granulocytes: 0 %
Lymphocytes Absolute: 3.6 10*3/uL — ABNORMAL HIGH (ref 0.7–3.1)
Lymphs: 34 %
MCH: 28.5 pg (ref 26.6–33.0)
MCHC: 33.2 g/dL (ref 31.5–35.7)
MCV: 86 fL (ref 79–97)
Monocytes Absolute: 0.7 10*3/uL (ref 0.1–0.9)
Monocytes: 6 %
Neutrophils Absolute: 6.1 10*3/uL (ref 1.4–7.0)
Neutrophils: 57 %
Platelets: 373 10*3/uL (ref 150–450)
RBC: 4.28 x10E6/uL (ref 3.77–5.28)
RDW: 14.1 % (ref 11.7–15.4)
WBC: 10.6 10*3/uL (ref 3.4–10.8)

## 2022-03-17 LAB — LIPID PANEL
Chol/HDL Ratio: 5.2 ratio — ABNORMAL HIGH (ref 0.0–4.4)
Cholesterol, Total: 213 mg/dL — ABNORMAL HIGH (ref 100–199)
HDL: 41 mg/dL (ref >39)
LDL Chol Calc (NIH): 148 mg/dL — ABNORMAL HIGH (ref 0–99)
Triglycerides: 131 mg/dL (ref 0–149)
VLDL Cholesterol Cal: 24 mg/dL (ref 5–40)

## 2022-03-17 LAB — TSH: TSH: 1.51 u[IU]/mL (ref 0.450–4.500)

## 2022-03-23 ENCOUNTER — Telehealth: Payer: Self-pay | Admitting: *Deleted

## 2022-03-23 NOTE — Telephone Encounter (Signed)
Contacted patient in regards to PREP Class referral. Voice message left requesting return call.

## 2022-04-06 ENCOUNTER — Ambulatory Visit (HOSPITAL_BASED_OUTPATIENT_CLINIC_OR_DEPARTMENT_OTHER): Payer: 59 | Admitting: Cardiovascular Disease

## 2022-04-21 ENCOUNTER — Encounter (INDEPENDENT_AMBULATORY_CARE_PROVIDER_SITE_OTHER): Payer: Self-pay

## 2022-06-14 ENCOUNTER — Ambulatory Visit (HOSPITAL_BASED_OUTPATIENT_CLINIC_OR_DEPARTMENT_OTHER): Payer: 59 | Admitting: Cardiovascular Disease

## 2022-06-14 NOTE — Progress Notes (Incomplete)
Advanced Hypertension Clinic Initial Assessment:    Date:  06/14/2022   ID:  Dana Todd, DOB 1986/04/26, MRN [id number]  PCP:  Leilani Able, MD  Cardiologist:  Christell Constant, MD  Nephrologist:  Referring MD: Leilani Able, MD   CC: Hypertension  History of Present Illness:    Dana Todd is a 37 y.o. female with a hx of hypertension, GERD, anxiety, and depression, here to establish care in the Advanced Hypertension Clinic. She last saw Dr. Izora Ribas 06/2021 for a murmur and shortness of breath. She had an Echo that revealed 60-65% and normal diastolic function. Blood pressure at that time was 118/72. She reported palpitations and a ZIO monitor was ordered but not completed. She saw Dr. Cherly Hensen 12/2021 and her blood pressure was 138/100 on HCTZ, so she was referred to the Advanced Hypertension Clinic.  She had previously been under a lot of stress. Her BP had been fluctuating low and high at home. She was also concerned about her weight gain from 130 pounds to 237 pounds within a few years. She noticed an increased heart rate in the 170s, and decreased to 125 bpm upon resting for 5 minutes. She also complained of heart palpitations following her surgery 04/2021, which lasts up to 20 minutes. She believed her anxiety may play a role. Her fiance reported heavy breathing at night and snoring.  Today, she is accompanied by her fiancee. She has been under a lot of stress since the unexpected loss of her father. Initially she was diagnosed with hypertension when she was 37 yo, which she attributes to significant work stress. Lately her blood pressures have been fluctuating low and high at home. 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 This is very stressful as she often deals with irate clients. She is also concerned about gaining weight. A few years ago she was 130 lbs and is currently 237 lbs. She attributes her weight gain to inactivity as she typically doesn't eat very  much.  In the past 2 weeks she worked 121 hours; she does not have much time for formal exercise but does try to go walking. On Saturday, she went walking and noticed her heart rate rose to the 170s. However, after resting for 5 minutes her heart rate was still as high as 125 bpm. After her surgery in 04/2021 she began to have heart palpitations. She "feels her heart drop" all the time. This may last for up to 20 minutes. One guaranteed trigger of her heart drop sensations is trying to drive on the highway. She is forced to pull over and her fiancee will drive. Due to her palpitations, she has tried cutting out coffee and sodas since 06/2021. She has also cut back significantly on alcohol consumption, which is now rare. Additionally she believes her palpitations may be related to her anxiety. She states that her anxiety medication is not working well. Regarding her diet she may order out occasionally, but she usually microwaves frozen foods. Normally she monitors her sodium intake.  Current supplements include organic magnesium, and vitamin D. She also drinks Pedialyte. For 3 days a month during her menses she usually takes ibuprofen. Her fiancee reports that at night she may sound like she is breathing heavily, and she snores during REM sleep. She denies any chest pain, shortness of breath, or peripheral edema. No lightheadedness, headaches, syncope, orthopnea, or PND.  Today,  She denies any palpitations, chest pain, shortness of breath, or peripheral edema. No lightheadedness,  headaches, syncope, orthopnea, or PND.  (+)  Past Medical History:  Diagnosis Date   Anxiety    Depression    DOE (dyspnea on exertion)    Essential hypertension 03/15/2022   GERD (gastroesophageal reflux disease)    PVC's (premature ventricular contractions) 03/15/2022   Retained intrauterine contraceptive device (IUD) 04/07/2021   Wears partial dentures    upper    Past Surgical History:  Procedure Laterality Date    HYSTEROSCOPY WITH RESECTOSCOPE N/A 04/17/2021   Procedure: HYSTEROSCOPY;  Surgeon: Servando Salina, MD;  Location: [place];  Service: Gynecology;  Laterality: N/A;   TUBAL LIGATION Bilateral 04/17/2021   Procedure: BILATERAL TUBAL LIGATION;  Surgeon: Servando Salina, MD;  Location: [place];  Service: Gynecology;  Laterality: Bilateral;    Current Medications: No outpatient medications have been marked as taking for the 06/14/22 encounter (Appointment) with Skeet Latch, MD.     Allergies:   Gelatin, Other, and Pork-derived products   Social History   Socioeconomic History   Marital status: Single    Spouse name: Not on file   Number of children: 0   Years of education: 15   Highest education level: Not on file  Occupational History   Occupation: Hotel manager support    Comment: At home for [employer]  Tobacco Use   Smoking status: Never   Smokeless tobacco: Never  Vaping Use   Vaping Use: Never used  Substance and Sexual Activity   Alcohol use: Yes    Comment: occ   Drug use: No   Sexual activity: Not on file  Other Topics Concern   Not on file  Social History Narrative   Lives in [place], married with spouse , has one dog.  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 Grew up in [place] and was raised by mom. Has 2 brother and pt is middle child. 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    Social Determinants of Health   Financial Resource Strain: Low Risk  (03/15/2022)   Overall Financial Resource Strain (CARDIA)    Difficulty of Paying Living Expenses: Not hard at all  Food Insecurity: No Food Insecurity (03/15/2022)   Hunger Vital Sign    Worried About Running Out of Food in the Last Year: Never true    Ran Out of Food in the Last Year: Never true  Transportation Needs: No Transportation Needs (03/15/2022)   PRAPARE - Hydrologist (Medical): No    Lack of  Transportation (Non-Medical): No  Physical Activity: Inactive (03/15/2022)   Exercise Vital Sign    Days of Exercise per Week: 0 days    Minutes of Exercise per Session: 0 min  Stress: Not on file  Social Connections: Not on file     Family History: The patient's family history includes Anxiety disorder in her brother and mother; Bipolar disorder in her mother; COPD in her father; Depression in her brother; Diverticulitis in her mother; Hypertension in her mother; Other in her father; Post-traumatic stress disorder in her brother; Schizophrenia in her maternal grandfather and mother. There is no history of Suicidality, Colon cancer, or Esophageal cancer.  ROS:   Please see the history of present illness.    (+) All other systems reviewed and are negative.  EKGs/Labs/Other Studies Reviewed:    Echocardiogram  06/25/2021:  1. Left ventricular ejection fraction, by estimation, is 60 to 65%. The  left ventricle has  normal function. The left ventricle has no regional  wall motion abnormalities. Left ventricular diastolic parameters were  normal.   2. Right ventricular systolic function is normal. The right ventricular  size is normal.   3. The mitral valve is normal in structure. No evidence of mitral valve  regurgitation. No evidence of mitral stenosis.   4. The aortic valve is normal in structure. Aortic valve regurgitation is  not visualized. No aortic stenosis is present.   5. The inferior vena cava is normal in size with greater than 50%  respiratory variability, suggesting right atrial pressure of 3 mmHg.    CTA Chest  05/08/2021: IMPRESSION: 1. No segmental or larger pulmonary embolus. 2. No acute thoracic abnormality.  EKG:  EKG is personally reviewed. 06/14/2022: Sinus ***. Rate *** bpm. 03/15/2022: Sinus rhythm. Rate 97 bpm. PVCs.    Recent Labs: 06/16/2021: Magnesium 2.1 03/16/2022: ALT 11; BUN 4; Creatinine, Ser 0.77; Hemoglobin 12.2; Platelets 373; Potassium 3.8; Sodium  138; TSH 1.510   Recent Lipid Panel    Component Value Date/Time   CHOL 213 (H) 03/16/2022 1112   TRIG 131 03/16/2022 1112   HDL 41 03/16/2022 1112   CHOLHDL 5.2 (H) 03/16/2022 1112   CHOLHDL 3 02/24/2017 0729   VLDL 18.6 02/24/2017 0729   LDLCALC 148 (H) 03/16/2022 1112    Physical Exam:    VS:  There were no vitals taken for this visit. , BMI There is no height or weight on file to calculate BMI. GENERAL:  Well appearing HEENT: Pupils equal round and reactive, fundi not visualized, oral mucosa unremarkable NECK:  No jugular venous distention, waveform within normal limits, carotid upstroke brisk and symmetric, no bruits, no thyromegaly LUNGS:  Clear to auscultation bilaterally HEART:  RRR.  PMI not displaced or sustained,S1 and S2 within normal limits, no S3, no S4, no clicks, no rubs, no murmurs ABD:  Flat, positive bowel sounds normal in frequency in pitch, no bruits, no rebound, no guarding, no midline pulsatile mass, no hepatomegaly, no splenomegaly EXT:  2 plus pulses throughout, no edema, no cyanosis no clubbing SKIN:  No rashes no nodules NEURO:  Cranial nerves II through XII grossly intact, motor grossly intact throughout PSYCH:  Cognitively intact, oriented to person place and time   ASSESSMENT/PLAN:    No problem-specific Assessment & Plan notes found for this encounter.    Screening for Secondary Hypertension:     03/15/2022   10:45 AM  Causes  Drugs/Herbals Screened     - Comments No caffeine.  Rare EtOH.  No tobacco.  Ibuprofen 3 days per month.  Sleep Apnea Screened     - Comments check sleep study    Relevant Labs/Studies:    Latest Ref Rng & Units 03/16/2022   11:12 AM 06/16/2021   11:05 AM 05/14/2021    6:26 AM  Basic Labs  Sodium 134 - 144 mmol/L 138  140  138   Potassium 3.5 - 5.2 mmol/L 3.8  4.6  4.5   Creatinine 0.57 - 1.00 mg/dL 9.67  5.91  6.38        Latest Ref Rng & Units 03/16/2022   11:12 AM 05/08/2021    2:54 PM  Thyroid   TSH  0.450 - 4.500 uIU/mL 1.510  1.210      Disposition:    FU with Tiffany C. Duke Salvia, MD, [facility] in ***3-4 months.  Medication Adjustments/Labs and Tests Ordered: Current medicines are reviewed at length with the patient today.  Concerns regarding  medicines are outlined above.   No orders of the defined types were placed in this encounter.  No orders of the defined types were placed in this encounter.   I,Mitra Faeizi,acting as a Neurosurgeon for [employer], MD.,have documented all relevant documentation on the behalf of Chilton Si, MD,as directed by  Chilton Si, MD while in the presence of Chilton Si, MD.   I, Tiffany C. Duke Salvia, MD have reviewed all documentation for this visit.  The documentation of the exam, diagnosis, procedures, and orders on 06/14/2022 are all accurate and complete.   Francene Castle  06/14/2022 7:28 AM    [facility]

## 2022-07-02 NOTE — Progress Notes (Signed)
Advanced [facility]pertension Clinic Follow-up Assessment:    Date:  07/05/2022   ID:  Dana Todd, DOB June 24, 1985, MRN [id number]  PCP:  Lin Landsman, MD  Cardiologist:  Werner Lean, MD  Nephrologist:  Referring MD: Lin Landsman, MD   CC: Hypertension  History of Present Illness:    Dana Todd is a 37 y.o. female with a hx of hypertension, GERD, anxiety, and depression, here for follow-up. She was initially seen 03/15/2022 in the [facility]. She last saw Dr. Gasper Sells 06/2021 for a murmur and shortness of breath. She had an Echo that revealed 60-65% and normal diastolic function. Blood pressure at that time was 118/72. She reported palpitations and a ZIO monitor was ordered but not completed. She saw Dr. Garwin Brothers 12/2021 and her blood pressure was 138/100 on HCTZ, so she was referred to the [facility].  She was diagnosed with hypertension at 37 years old, which she attributed to significant work stress at the time. 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 Occasional episodes of palpitations lasting up to 20 minutes ever since her hysteroscopy with tubal ligation 04/2021. She reported weight gain from 130 to 237 lbs over a few years. Kidney and liver function were normal. LDL was elevated at 148. She was referred to [facility]. She was referred for a sleep study. Her blood pressure was 112/86 and no changes were made to her medication.  Today, she states she is feeling much better. No chest pain or pressure. At home her blood pressures have decreased since December. Highest was 950 systolic, but she was feeling sick that day. More recently she has average readings of 130s/80s-90s. No side effects from HCTZ. Her stress is significantly improved, and she is staying hydrated with more water and Pedialyte.  Over the last month her resting heart rate has been in the 70's. Her pounding palpitations are almost completely resolved; she believes they were mostly related to being dehydrated. Currently she is exercising 3 days a week. She recently sent in her sleep study. She denies any chest pain, shortness of breath, or peripheral edema. No lightheadedness, headaches, syncope, orthopnea, or PND.   Past Medical History:  Diagnosis Date   Anxiety    Depression    DOE (dyspnea on exertion)    Essential hypertension 03/15/2022   GERD (gastroesophageal reflux disease)    PVC's (premature ventricular contractions) 03/15/2022   Retained intrauterine contraceptive device (IUD) 04/07/2021   Wears partial dentures    upper    Past Surgical History:  Procedure Laterality Date   HYSTEROSCOPY WITH RESECTOSCOPE N/A 04/17/2021   Procedure: HYSTEROSCOPY;  Surgeon: Servando Salina, MD;  Location: [facility];  Service: Gynecology;  Laterality: N/A;   TUBAL LIGATION Bilateral 04/17/2021   Procedure: BILATERAL TUBAL LIGATION;  Surgeon: Servando Salina, MD;  Location: [facility];  Service: Gynecology;  Laterality: Bilateral;    Current Medications: Current Meds  Medication Sig   amLODipine (NORVASC) 2.5 MG tablet Take 1 tablet (2.5 mg total) by mouth daily.   cholecalciferol (VITAMIN D) 1000 units tablet Take 1,000 Units by mouth daily.   hydrochlorothiazide (HYDRODIURIL) 25 MG tablet Take 25 mg by mouth daily.   hydrOXYzine (ATARAX) 25 MG tablet Take 1 tablet (25 mg total) by mouth every 8 (eight) hours as needed for anxiety.   ibuprofen (  ADVIL) 800 MG tablet Take 1 tablet (800 mg total) by mouth every 8 (eight) hours as needed.   Multiple Vitamins-Minerals (MULTIVITAMIN WITH MINERALS) tablet Take 1 tablet by mouth daily. Mary ruth multivitamin daily with vitamin e   pantoprazole (PROTONIX) 40 MG tablet Take 1 tablet (40 mg total) by mouth daily.     Allergies:    Gelatin, Other, and Pork-derived products   Social History   Socioeconomic History   Marital status: Single    Spouse name: Not on file   Number of children: 0   Years of education: 15   Highest education level: Not on file  Occupational History   Occupation: Hotel manager support    Comment: At home for [employer]  Tobacco Use   Smoking status: Never   Smokeless tobacco: Never  Vaping Use   Vaping Use: Never used  Substance and Sexual Activity   Alcohol use: Yes    Comment: occ   Drug use: No   Sexual activity: Not on file  Other Topics Concern   Not on file  Social History Narrative   Lives in [place], married with spouse , has one dog.  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 Grew up in [place] and was raised by mom. Has 2 brother and pt is middle child. 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    Social Determinants of Health   Financial Resource Strain: Low Risk  (03/15/2022)   Overall Financial Resource Strain (CARDIA)    Difficulty of Paying Living Expenses: Not hard at all  Food Insecurity: No Food Insecurity (03/15/2022)   Hunger Vital Sign    Worried About Running Out of Food in the Last Year: Never true    Ran Out of Food in the Last Year: Never true  Transportation Needs: No Transportation Needs (03/15/2022)   PRAPARE - Hydrologist (Medical): No    Lack of Transportation (Non-Medical): No  Physical Activity: Inactive (03/15/2022)   Exercise Vital Sign    Days of Exercise per Week: 0 days    Minutes of Exercise per Session: 0 min  Stress: Not on file  Social Connections: Not on file     Family History: The patient's family history includes Anxiety disorder in her brother and mother; Bipolar disorder in her mother; COPD in her father; Depression in her brother; Diverticulitis in her mother; Hypertension in her mother; Other in her father; Post-traumatic stress disorder in her brother; Schizophrenia  in her maternal grandfather and mother. There is no history of Suicidality, Colon cancer, or Esophageal cancer.  ROS:   Please see the history of present illness.    All other systems reviewed and are negative.  EKGs/Labs/Other Studies Reviewed:    Echocardiogram  06/25/2021:  1. Left ventricular ejection fraction, by estimation, is 60 to 65%. The  left ventricle has normal function. The left ventricle has no regional  wall motion abnormalities. Left ventricular diastolic parameters were  normal.   2. Right ventricular systolic function is normal. The right ventricular  size is normal.   3. The mitral valve is normal in structure. No evidence of mitral valve  regurgitation. No evidence of mitral stenosis.   4. The aortic valve is normal in structure. Aortic valve regurgitation is  not visualized. No aortic stenosis is present.   5. The inferior vena cava is normal in size with greater than 50%  respiratory variability, suggesting right atrial  pressure of 3 mmHg.   CTA Chest  05/08/2021: IMPRESSION: 1. No segmental or larger pulmonary embolus. 2. No acute thoracic abnormality.  EKG:  EKG is personally reviewed. 07/05/2022:  EKG was not ordered. 03/15/2022: Sinus rhythm. Rate 97 bpm. PVCs.    Recent Labs: 03/16/2022: ALT 11; BUN 4; Creatinine, Ser 0.77; Hemoglobin 12.2; Platelets 373; Potassium 3.8; Sodium 138; TSH 1.510   Recent Lipid Panel    Component Value Date/Time   CHOL 213 (H) 03/16/2022 1112   TRIG 131 03/16/2022 1112   HDL 41 03/16/2022 1112   CHOLHDL 5.2 (H) 03/16/2022 1112   CHOLHDL 3 02/24/2017 0729   VLDL 18.6 02/24/2017 0729   LDLCALC 148 (H) 03/16/2022 1112    Physical Exam:    VS:  BP 128/85 (BP Location: Left Arm, Patient Position: Sitting, Cuff Size: Large)   Pulse (!) 103   Ht 5' (1.524 m)   Wt 238 lb 12.8 oz (108.3 kg)   SpO2 99%   BMI 46.64 kg/m  , BMI Body mass index is 46.64 kg/m. GENERAL:  Well appearing HEENT: Pupils equal round and  reactive, fundi not visualized, oral mucosa unremarkable NECK:  No jugular venous distention, waveform within normal limits, carotid upstroke brisk and symmetric, no bruits, no thyromegaly LUNGS:  Clear to auscultation bilaterally HEART:  RRR.  PMI not displaced or sustained,S1 and S2 within normal limits, no S3, no S4, no clicks, no rubs, no murmurs ABD:  Flat, positive bowel sounds normal in frequency in pitch, no bruits, no rebound, no guarding, no midline pulsatile mass, no hepatomegaly, no splenomegaly EXT:  2 plus pulses throughout, no edema, no cyanosis no clubbing SKIN:  No rashes no nodules NEURO:  Cranial nerves II through XII grossly intact, motor grossly intact throughout PSYCH:  Cognitively intact, oriented to person place and time   ASSESSMENT/PLAN:    PVC's (premature ventricular contractions) Palpitations have been better controlled. She has been limiting her stress and heart rates are no longer elevated at rest.  Essential hypertension Blood pressure is better controlled but still above her goal of less than 130/80.  It has been high both here and at home.  She is good to keep working on diet and exercise.  She is waiting for her first appointment with the healthy weight [facility] wellness clinic.  She will start there in March.  Continue HCTZ and add amlodipine 2.5 mg daily.  She will check her blood pressures at home.  Her goal is to get off of her medications and we discussed the fact that once her blood pressure start to get low then we will pull back on the medicine.  For now, we want to protect her heart, brain, and kidneys by lowering her blood pressure.  Sleep study results are pending.  History of tachycardia Symptoms improved.  She continues to work on limiting her stress levels and increasing her hydration.   Screening for Secondary Hypertension:     03/15/2022   10:45 AM  Causes  Drugs/Herbals Screened     - Comments No caffeine.  Rare EtOH.  No tobacco.  Ibuprofen  3 days per month.  Sleep Apnea Screened     - Comments check sleep study    Relevant Labs/Studies:    Latest Ref Rng & Units 03/16/2022   11:12 AM 06/16/2021   11:05 AM 05/14/2021    6:26 AM  Basic Labs  Sodium 134 - 144 mmol/L 138  140  138   Potassium 3.5 - 5.2  mmol/L 3.8  4.6  4.5   Creatinine 0.57 - 1.00 mg/dL 2.70  6.23  7.62        Latest Ref Rng & Units 03/16/2022   11:12 AM 05/08/2021    2:54 PM  Thyroid   TSH 0.450 - 4.500 uIU/mL 1.510  1.210      Disposition:    FU with Jamiah Recore C. Duke Salvia, MD, [facility] in 4 months.  Medication Adjustments/Labs and Tests Ordered: Current medicines are reviewed at length with the patient today.  Concerns regarding medicines are outlined above.   No orders of the defined types were placed in this encounter.  Meds ordered this encounter  Medications   amLODipine (NORVASC) 2.5 MG tablet    Sig: Take 1 tablet (2.5 mg total) by mouth daily.    Dispense:  90 tablet    Refill:  3   I,Mathew Stumpf,acting as a scribe for Chilton Si, MD.,have documented all relevant documentation on the behalf of Chilton Si, MD,as directed by  Chilton Si, MD while in the presence of Chilton Si, MD.  I, Tinya Cadogan C. Duke Salvia, MD have reviewed all documentation for this visit.  The documentation of the exam, diagnosis, procedures, and orders on 07/05/2022 are all accurate and complete.  Signed, Chilton Si, MD  07/05/2022 9:39 AM    [facility]

## 2022-07-04 ENCOUNTER — Encounter (INDEPENDENT_AMBULATORY_CARE_PROVIDER_SITE_OTHER): Payer: 59 | Admitting: Cardiology

## 2022-07-04 DIAGNOSIS — G4733 Obstructive sleep apnea (adult) (pediatric): Secondary | ICD-10-CM | POA: Diagnosis not present

## 2022-07-05 ENCOUNTER — Encounter (HOSPITAL_BASED_OUTPATIENT_CLINIC_OR_DEPARTMENT_OTHER): Payer: Self-pay | Admitting: Cardiovascular Disease

## 2022-07-05 ENCOUNTER — Ambulatory Visit (INDEPENDENT_AMBULATORY_CARE_PROVIDER_SITE_OTHER): Payer: 59 | Admitting: Cardiovascular Disease

## 2022-07-05 VITALS — BP 128/85 | HR 103 | Ht 60.0 in | Wt 238.8 lb

## 2022-07-05 DIAGNOSIS — I493 Ventricular premature depolarization: Secondary | ICD-10-CM | POA: Diagnosis not present

## 2022-07-05 DIAGNOSIS — Z87898 Personal history of other specified conditions: Secondary | ICD-10-CM | POA: Diagnosis not present

## 2022-07-05 DIAGNOSIS — I10 Essential (primary) hypertension: Secondary | ICD-10-CM

## 2022-07-05 MED ORDER — AMLODIPINE BESYLATE 2.5 MG PO TABS: 2.5000 mg | ORAL_TABLET | Freq: Every day | ORAL | 3 refills | Status: DC

## 2022-07-05 NOTE — Assessment & Plan Note (Signed)
Symptoms improved.  She continues to work on limiting her stress levels and increasing her hydration.

## 2022-07-05 NOTE — Assessment & Plan Note (Signed)
Palpitations have been better controlled. She has been limiting her stress and heart rates are no longer elevated at rest.

## 2022-07-05 NOTE — Patient Instructions (Addendum)
Medication Instructions:  Your physician has recommended you make the following change in your medication:   Start: Amlodipine 2.5mg  daily   Follow-Up: 11/04/2022 10:30 AM WITH DR [facility]

## 2022-07-05 NOTE — Assessment & Plan Note (Addendum)
Blood pressure is better controlled but still above her goal of less than 130/80.  It has been high both here and at home.  She is good to keep working on diet and exercise.  She is waiting for her first appointment with the healthy weight and wellness clinic.  She will start there in March.  Continue HCTZ and add amlodipine 2.5 mg daily.  She will check her blood pressures at home.  Her goal is to get off of her medications and we discussed the fact that once her blood pressure start to get low then we will pull back on the medicine.  For now, we want to protect her heart, brain, and kidneys by lowering her blood pressure.  Sleep study results are pending.

## 2022-07-06 NOTE — Procedures (Signed)
Patient Information Study Date: 07/04/2022 Patient Name: Charlize Hathaway Patient ID: [id number] Birth Date: Aug 22, 1985 Age: 37 Gender: Female BMI: 46.7 (W=238 lb, H=5' 0'') Referring Physician: Skeet Latch, MD  TEST DESCRIPTION:  Home sleep apnea testing was completed using the WatchPat, a Type 1 device, utilizing peripheral arterial tonometry (PAT), chest movement, actigraphy, pulse oximetry, pulse rate, body position and snore.  AHI was calculated with apnea and hypopnea using valid sleep time as the denominator. RDI includes apneas, hypopneas, and RERAs.  The data acquired and the scoring of sleep and all associated events were performed in accordance with the recommended standards and specifications as outlined in the AASM Manual for the Scoring of Sleep and Associated Events 2.2.0 (2015).  FINDINGS:  1.  Moderate Obstructive Sleep Apnea with AHI 25.6/hr.   2.  No Central Sleep Apnea with pAHIc 0/hr.  3.  Oxygen desaturations as low as %.  4.  Mild snoring was present. O2 sats were < 88% for 2.9 min.  5.  Total sleep time was 6 hrs and 39 min.  6.  33.9% of total sleep time was spent in REM sleep.   7.  Normal sleep onset latency at 20 min  8.  Prolonged REM sleep onset latency at 108 min.   9.  Total awakenings were 1.  10. Arrhythmia detection:  None.  DIAGNOSIS:   Moderate Obstructive Sleep Apnea (G47.33)  RECOMMENDATIONS:   1.  Clinical correlation of these findings is necessary.  The decision to treat obstructive sleep apnea (OSA) is usually based on the presence of apnea symptoms or the presence of associated medical conditions such as Hypertension, Congestive Heart Failure, Atrial Fibrillation or Obesity.  The most common symptoms of OSA are snoring, gasping for breath while sleeping, daytime sleepiness and fatigue.   2.  Initiating apnea therapy is recommended given the presence of symptoms and/or associated conditions. Recommend proceeding with one of the  following:     a.  Auto-CPAP therapy with a pressure range of 5-20cm H2O.     b.  An oral appliance (OA) that can be obtained from certain dentists with expertise in sleep medicine.  These are primarily of use in non-obese patients with mild and moderate disease.     c.  An ENT consultation which may be useful to look for specific causes of obstruction and possible treatment options.     d.  If patient is intolerant to PAP therapy, consider referral to ENT for evaluation for hypoglossal nerve stimulator.   3.  Close follow-up is necessary to ensure success with CPAP or oral appliance therapy for maximum benefit.  4.  A follow-up oximetry study on CPAP is recommended to assess the adequacy of therapy and determine the need for supplemental oxygen or the potential need for Bi-level therapy.  An arterial blood gas to determine the adequacy of baseline ventilation and oxygenation should also be considered.  5.  Healthy sleep recommendations include:  adequate nightly sleep (normal 7-9 hrs/night), avoidance of caffeine after noon and alcohol near bedtime, and maintaining a sleep environment that is cool, dark and quiet.  6.  Weight loss for overweight patients is recommended.  Even modest amounts of weight loss can significantly improve the severity of sleep apnea.  7.  Snoring recommendations include:  weight loss where appropriate, side sleeping, and avoidance of alcohol before bed.  8.  Operation of motor vehicle should not be performed when sleepy.  Signature: Fransico Him, MD; [facility]; [place], [place] Board of  Sleep Medicine Electronically Signed: 07/07/2022

## 2022-07-07 ENCOUNTER — Ambulatory Visit: Payer: 59 | Attending: Cardiovascular Disease

## 2022-07-07 DIAGNOSIS — R400 Somnolence: Secondary | ICD-10-CM

## 2022-07-07 DIAGNOSIS — I10 Essential (primary) hypertension: Secondary | ICD-10-CM

## 2022-07-07 DIAGNOSIS — R0683 Snoring: Secondary | ICD-10-CM

## 2022-07-19 ENCOUNTER — Telehealth: Payer: Self-pay | Admitting: *Deleted

## 2022-07-19 NOTE — Telephone Encounter (Signed)
-----   Message from Traci R Turner, MD sent at 07/06/2022  9:25 PM EST ----- Please let patient know that they have sleep apnea.  Recommend therapeutic CPAP titration for treatment of patient's sleep disordered breathing.  If unable to perform an in lab titration then initiate ResMed auto CPAP from 4 to 15cm H2O with heated humidity and mask of choice and overnight pulse ox on CPAP.   

## 2022-07-19 NOTE — Telephone Encounter (Signed)
Message left to return a call.

## 2022-07-20 ENCOUNTER — Other Ambulatory Visit: Payer: Self-pay | Admitting: Cardiology

## 2022-07-20 DIAGNOSIS — G4733 Obstructive sleep apnea (adult) (pediatric): Secondary | ICD-10-CM

## 2022-07-20 NOTE — Telephone Encounter (Signed)
Patient returned a call and was given sleep study results and recommendations. She agrees to proceed with CPAP titration pending insurance approval.

## 2022-07-20 NOTE — Telephone Encounter (Signed)
-----   Message from Sueanne Margarita, MD sent at 07/06/2022  9:25 PM EST ----- Please let patient know that they have sleep apnea.  Recommend therapeutic CPAP titration for treatment of patient's sleep disordered breathing.  If unable to perform an in lab titration then initiate ResMed auto CPAP from 4 to 15cm H2O with heated humidity and mask of choice and overnight pulse ox on CPAP.

## 2022-08-24 ENCOUNTER — Telehealth: Payer: Self-pay | Admitting: *Deleted

## 2022-08-24 NOTE — Telephone Encounter (Signed)
Received a denial for requested  CPAP titration. APAP and ONO order sent to [facility] via [facility] portal.

## 2022-08-31 ENCOUNTER — Telehealth (INDEPENDENT_AMBULATORY_CARE_PROVIDER_SITE_OTHER): Payer: Self-pay | Admitting: Family Medicine

## 2022-08-31 ENCOUNTER — Telehealth (HOSPITAL_BASED_OUTPATIENT_CLINIC_OR_DEPARTMENT_OTHER): Payer: Self-pay | Admitting: Cardiovascular Disease

## 2022-08-31 ENCOUNTER — Encounter (INDEPENDENT_AMBULATORY_CARE_PROVIDER_SITE_OTHER): Payer: Self-pay

## 2022-08-31 ENCOUNTER — Encounter (INDEPENDENT_AMBULATORY_CARE_PROVIDER_SITE_OTHER): Payer: 59 | Admitting: Family Medicine

## 2022-08-31 DIAGNOSIS — I10 Essential (primary) hypertension: Secondary | ICD-10-CM

## 2022-08-31 NOTE — Telephone Encounter (Signed)
Patient arrived at 12:27pm. Appointment was scheduled for 12:20pm

## 2022-08-31 NOTE — Telephone Encounter (Signed)
New Message:      Patient would like for Dr Oval Linsey to please refer her to another weight loss clinic.

## 2022-08-31 NOTE — Telephone Encounter (Signed)
Patient arrived at 12:27pm. Appointment was scheduled for 12:20pm. We tried to offer an appointment for tomorrow but the patient declined. They stated they were late due to waiting at the back door for a long time and no one answered.

## 2022-09-07 NOTE — Telephone Encounter (Signed)
September 04, 2022 Dana Latch, MD  to Dana Moss, RN  Me     09/04/22  8:38 AM [facility]er to [facility].  TCR  Per Dr Oval Linsey however patient requesting referral to another weight loss facility. Patient was scheduled to see [facility], arrived late and declined rescheduling visit, see Dr Leafy Ro phone note 3/26 Will fax referral to [facility]  [address] [place], [place] [address] Call: [phone] Fax: [phone]  Mychart message sent to patient

## 2022-09-23 NOTE — Telephone Encounter (Signed)
Below message sent to patient via mychart  Good afternoon   We did hear back from the [facility] clinic and unfortunately they said that you had been dismissed from all [facility] practices. They did not elaborate on the reason. Unfortunately we have no control over that or any other recommendations. Below is the link to an [facility] facility  [email or web address] [phone]  I did call and you do not need a referral, you can call directly if you would like to go there.   Juliette Alcide

## 2022-10-04 ENCOUNTER — Encounter: Payer: Self-pay | Admitting: Cardiology

## 2022-10-07 NOTE — Telephone Encounter (Signed)
Advised patient, verbalized understanding  

## 2022-10-09 IMAGING — CT CT ABD-PELV W/ CM
2 of 4 series · 17 of 46 positions shown, 19 images · IV contrast (omnipaque)
Comparison: None.

CLINICAL DATA: Diffuse abdominal pain with chills. Inflammatory
bowel disease (IBD)

EXAM:
CT ABDOMEN AND PELVIS WITH CONTRAST
TECHNIQUE: Multidetector CT imaging of the abdomen and pelvis was performed
using the standard protocol following bolus administration of
intravenous contrast.
CONTRAST:  100mL OMNIPAQUE IOHEXOL 300 MG/ML  SOLN

[Series 3: abdomen 5.0 · axial · 0.98mm/px · z∈[-816,-391]mm · 14 of 95 slices shown, 16 images]
[im 5/95  soft-tissue]
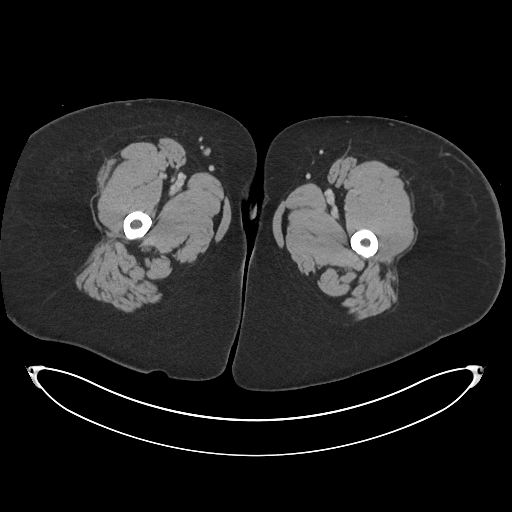
[im 5/95  bone]
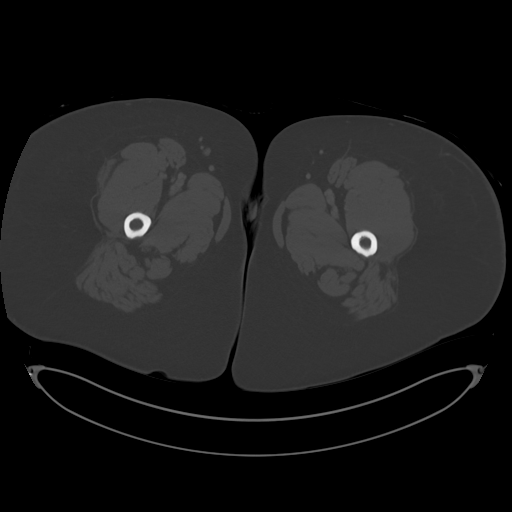
[im 10/95  soft-tissue]
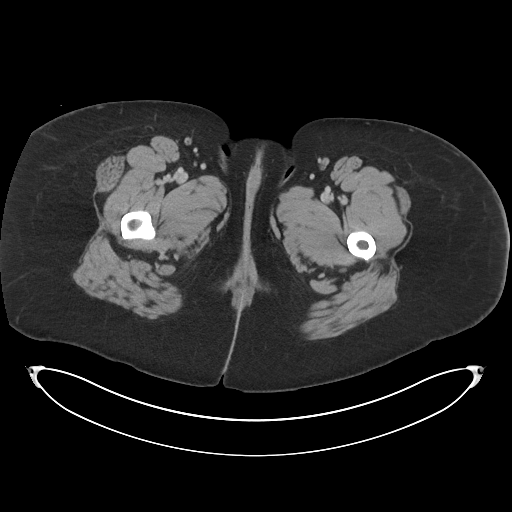
[im 20/95  soft-tissue]
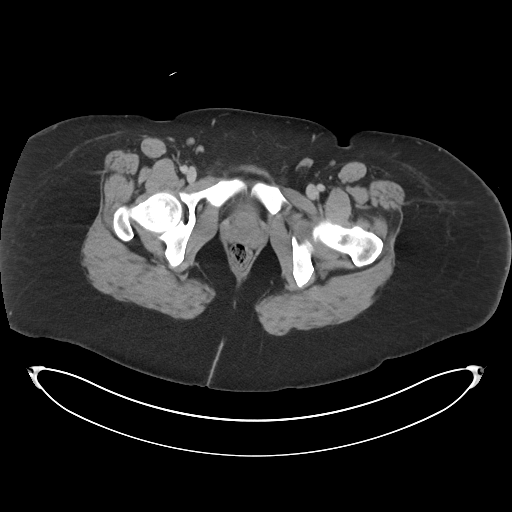
[im 25/95  soft-tissue]
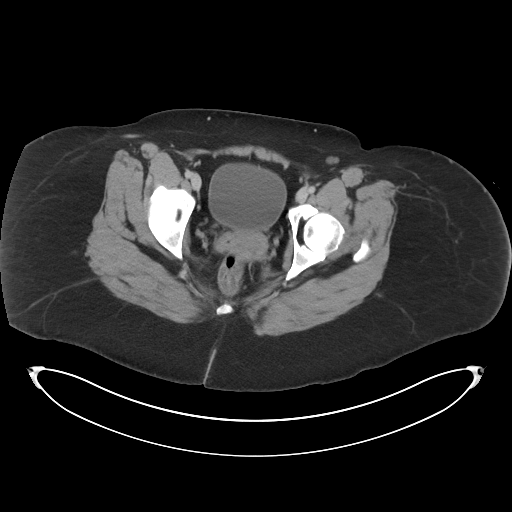
[im 30/95  soft-tissue]
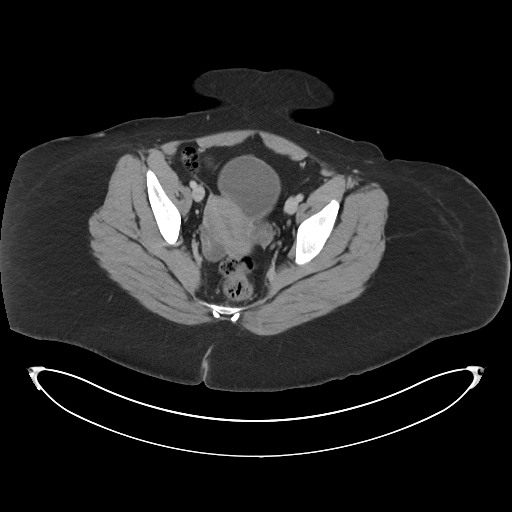
[im 40/95  soft-tissue]
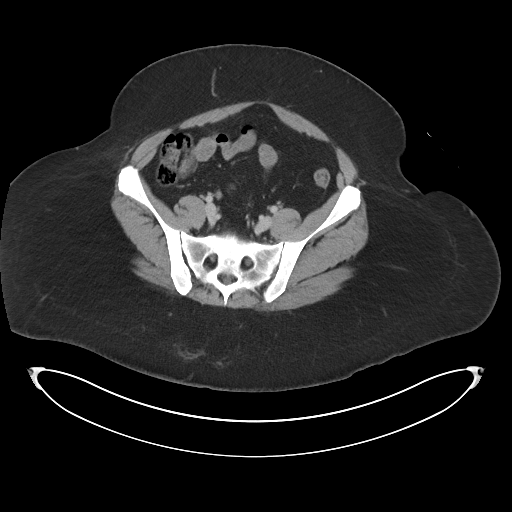
[im 45/95  soft-tissue]
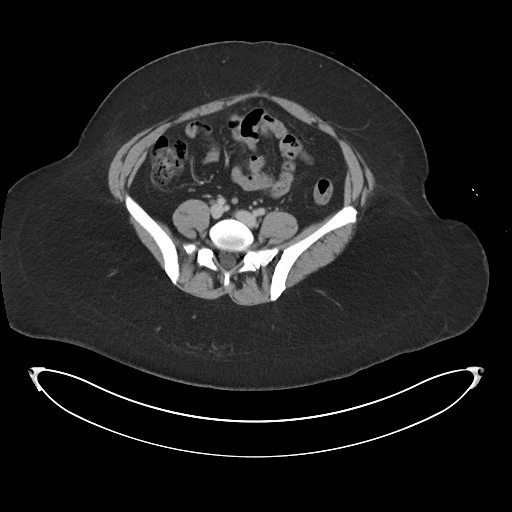
[im 50/95  soft-tissue]
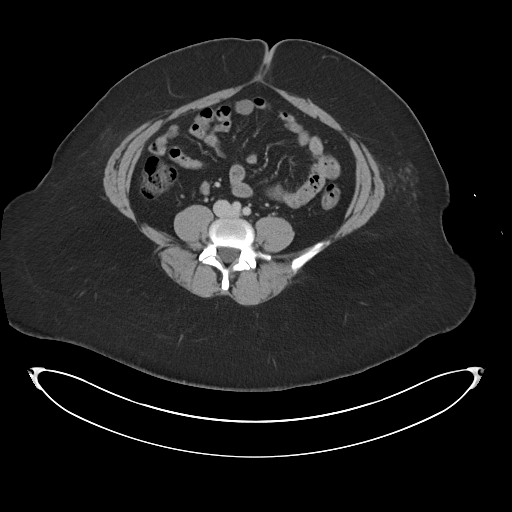
[im 55/95  soft-tissue]
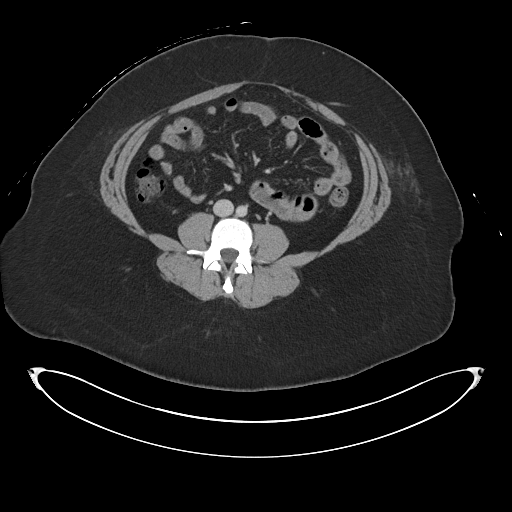
[im 55/95  bone]
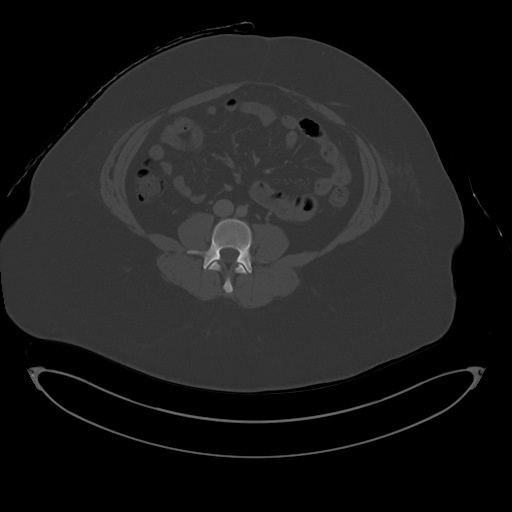
[im 65/95  soft-tissue]
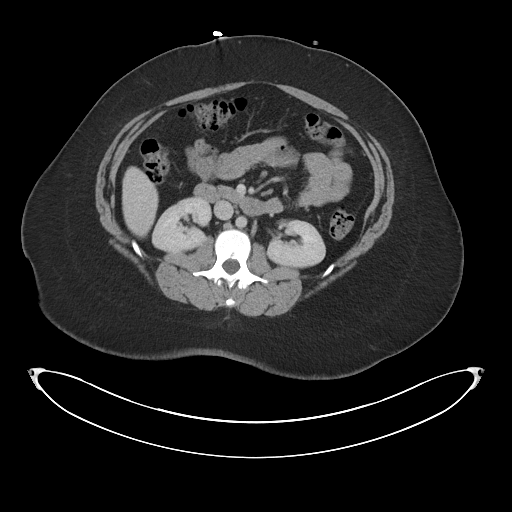
[im 70/95  soft-tissue]
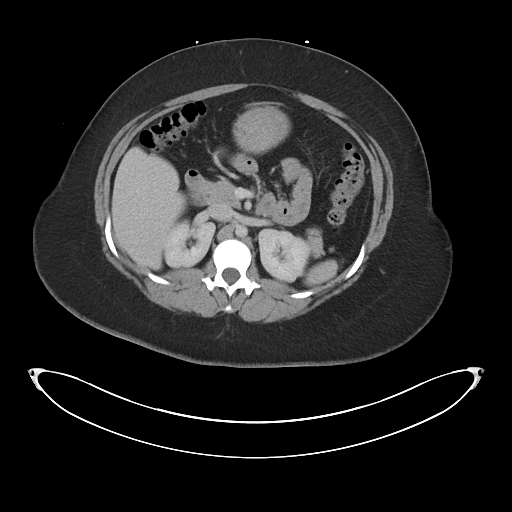
[im 75/95  soft-tissue]
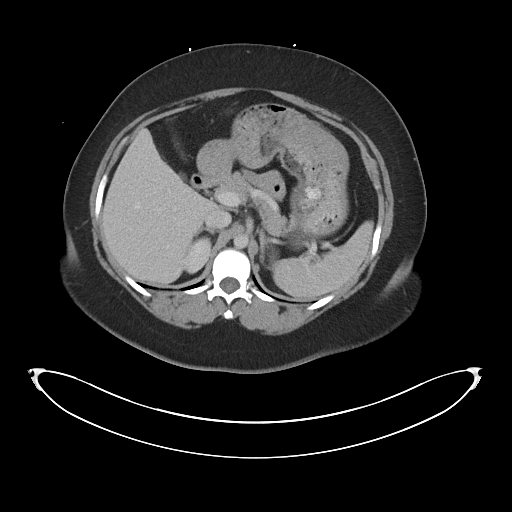
[im 85/95  soft-tissue]
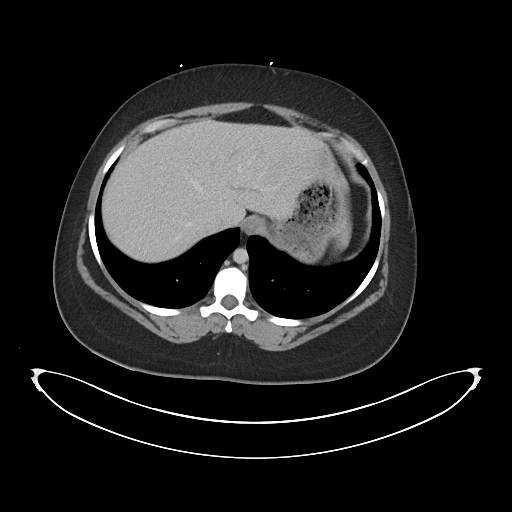
[im 90/95  soft-tissue]
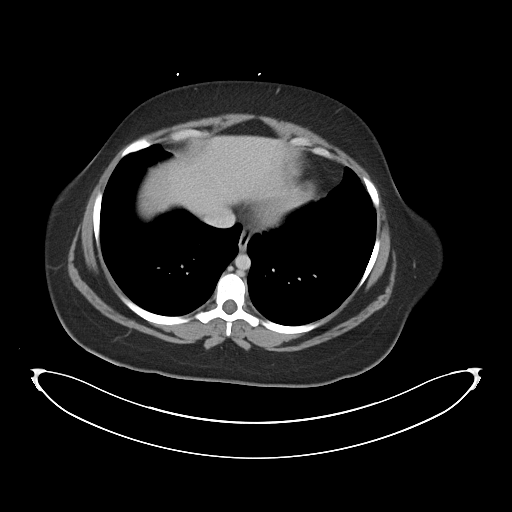

[Series 6: abdomen 3.0 mpr cor · coronal · 0.95mm/px · 3 of 129 slices shown]
[im 43/129  soft-tissue]
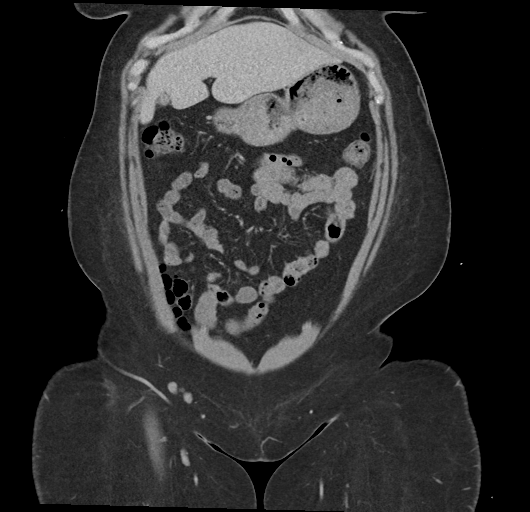
[im 57/129  soft-tissue]
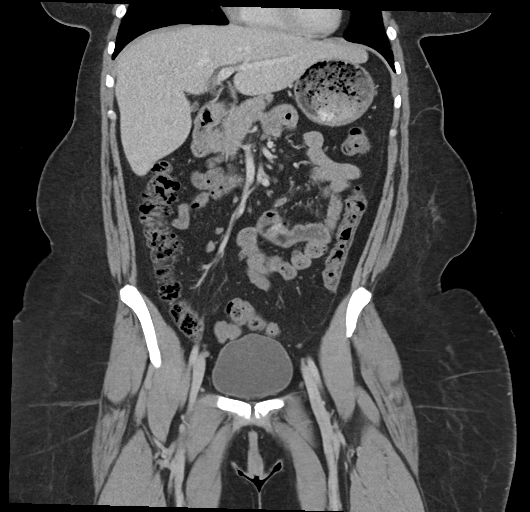
[im 72/129  soft-tissue]
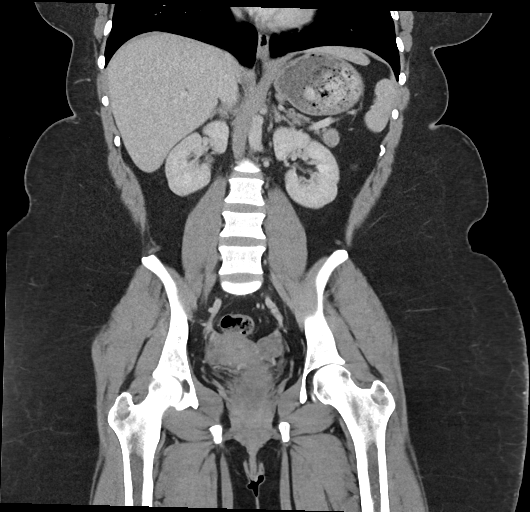

[17 of 46 positions shown; findings below may reference images not displayed]

FINDINGS: Lower chest: Included lung bases are clear.  Heart size is normal.

Hepatobiliary: Unremarkable appearance of the liver. No focal liver
abnormality. Gallbladder is partially contracted but appears
otherwise unremarkable. No hyperdense gallstone.

Pancreas: Unremarkable. No pancreatic ductal dilatation or
surrounding inflammatory changes.

Spleen: Normal in size without focal abnormality.

Adrenals/Urinary Tract: Unremarkable adrenal glands. Kidneys enhance
symmetrically without focal lesion, stone, or hydronephrosis.
Ureters are nondilated. Urinary bladder appears unremarkable.

Stomach/Bowel: Stomach is within normal limits. Appendix appears
normal (series 6, image 62). Terminal ileum within normal limits. No
evidence of bowel wall thickening, distention, or inflammatory
changes.

Vascular/Lymphatic: No significant vascular findings are present. No
enlarged abdominal or pelvic lymph nodes.

Reproductive: Unremarkable uterus. Bilateral ovaries are within
normal limits.

Other: No free fluid. No abdominopelvic fluid collection. No
pneumoperitoneum. No abdominal wall hernia.

Musculoskeletal: No acute or significant osseous findings.
IMPRESSION: No acute abdominopelvic findings.

## 2022-11-04 ENCOUNTER — Encounter (HOSPITAL_BASED_OUTPATIENT_CLINIC_OR_DEPARTMENT_OTHER): Payer: Self-pay | Admitting: Cardiovascular Disease

## 2022-11-04 ENCOUNTER — Ambulatory Visit (INDEPENDENT_AMBULATORY_CARE_PROVIDER_SITE_OTHER): Payer: 59 | Admitting: Cardiovascular Disease

## 2022-11-04 VITALS — BP 115/81 | HR 109 | Ht 60.0 in | Wt 231.7 lb

## 2022-11-04 DIAGNOSIS — I10 Essential (primary) hypertension: Secondary | ICD-10-CM

## 2022-11-04 DIAGNOSIS — F32A Depression, unspecified: Secondary | ICD-10-CM

## 2022-11-04 DIAGNOSIS — I4711 Inappropriate sinus tachycardia, so stated: Secondary | ICD-10-CM

## 2022-11-04 DIAGNOSIS — G4733 Obstructive sleep apnea (adult) (pediatric): Secondary | ICD-10-CM

## 2022-11-04 DIAGNOSIS — F419 Anxiety disorder, unspecified: Secondary | ICD-10-CM

## 2022-11-04 DIAGNOSIS — I493 Ventricular premature depolarization: Secondary | ICD-10-CM | POA: Diagnosis not present

## 2022-11-04 HISTORY — DX: Inappropriate sinus tachycardia, so stated: I47.11

## 2022-11-04 HISTORY — DX: Obstructive sleep apnea (adult) (pediatric): G47.33

## 2022-11-04 MED ORDER — METOPROLOL SUCCINATE ER 25 MG PO TB24: 25.0000 mg | ORAL_TABLET | Freq: Every day | ORAL | 3 refills | Status: DC

## 2022-11-04 MED ORDER — HYDROCHLOROTHIAZIDE 25 MG PO TABS: 25.0000 mg | ORAL_TABLET | Freq: Every day | ORAL | 3 refills | Status: DC

## 2022-11-04 NOTE — Patient Instructions (Addendum)
Medication Instructions:  START METOPROLOL SUC 25 MG DAILY   Labwork: NONE  Testing/Procedures: NONE  Follow-Up: 01/06/2023 10:55 am with Alanson Aly NP   Any Other Special Instructions Will Be Listed Below (If Applicable). MONITOR YOUR BLOOD PRESSURE TWICE A DAY AND BRING YOUR READINGS TO YOUR FOLLOW UP APPOINTMENT   If you need a refill on your cardiac medications before your next appointment, please call your pharmacy.

## 2022-11-04 NOTE — Progress Notes (Deleted)
Advanced Hypertension Clinic Follow-up Assessment:    Date:  11/04/2022   ID:  Dana Todd, DOB 30-Sep-1985, MRN [id number]  PCP:  Dana Able, MD  Cardiologist:  Dana Constant, MD   Referring MD: Dana Able, MD   CC: Hypertension  History of Present Illness:    Dana Todd is a 37 y.o. female with a hx of hypertension, PVCs, GERD, anxiety, and depression, here for follow-up. She was initially seen 03/15/2022 in the [facility] Clinic. She last saw Dr. Izora Todd 06/2021 for a murmur and shortness of breath. She had an Echo that revealed 60-65% and normal diastolic function. Blood pressure at that time was 118/72. She reported palpitations and a ZIO monitor was ordered but not completed. She saw Dr. Cherly Hensen 12/2021 and her blood pressure was 138/100 on HCTZ, so she was referred to the [facility]pertension Clinic.  She was diagnosed with hypertension at 37 years old, which she attributed to significant work stress at the time. 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 Occasional episodes of palpitations lasting up to 20 minutes ever since her hysteroscopy with tubal ligation 04/2021. She reported weight gain from 130 to 237 lbs over a few years. Kidney and liver function were normal. LDL was elevated at 148. She was referred to [facility] and [facility]. She was referred for a sleep study. Her blood pressure was 112/86 and no changes were made to her medication.  Her appointment 02/2023 she was feeling Todd and blood pressures are Todd controlled.  She had a sleep of the 08/2022 that was positive for sleep apnea and a CPAP titration was ordered.  ?  CPAP   Past Medical History:  Diagnosis Date   Anxiety    Depression    DOE (dyspnea on exertion)    Essential hypertension 03/15/2022   GERD (gastroesophageal reflux disease)    PVC's (premature  ventricular contractions) 03/15/2022   Retained intrauterine contraceptive device (IUD) 04/07/2021   Wears partial dentures    upper    Past Surgical History:  Procedure Laterality Date   HYSTEROSCOPY WITH RESECTOSCOPE N/A 04/17/2021   Procedure: HYSTEROSCOPY;  Surgeon: Dana Better, MD;  Location: [facility];  Service: Gynecology;  Laterality: N/A;   TUBAL LIGATION Bilateral 04/17/2021   Procedure: BILATERAL TUBAL LIGATION;  Surgeon: Dana Better, MD;  Location: [facility];  Service: Gynecology;  Laterality: Bilateral;    Current Medications: No outpatient medications have been marked as taking for the 11/04/22 encounter (Appointment) with Chilton Si, MD.     Allergies:   Gelatin, Other, and Pork-derived products   Social History   Socioeconomic History   Marital status: Single    Spouse name: Not on file   Number of children: 0   Years of education: 15   Highest education level: Not on file  Occupational History   Occupation: Scientist, product/process development support    Comment: At home for [employer]  Tobacco Use   Smoking status: Never   Smokeless tobacco: Never  Vaping Use   Vaping Use: Never used  Substance and Sexual Activity   Alcohol use: Yes    Comment: occ   Drug use: No   Sexual activity: Not on file  Other Topics Concern   Not on file  Social History Narrative   Lives in [place], married with spouse , has one dog.  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 Grew up in [place] and was raised by mom. Has 2 brother and pt is middle child. 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    Social Determinants of Health   Financial Resource Strain: Low Risk  (03/15/2022)   Overall Financial Resource Strain (CARDIA)    Difficulty of Paying Living Expenses: Not hard at all  Food Insecurity: No Food Insecurity (03/15/2022)   Hunger Vital Sign    Worried About Running Out of Food in the Last Year: Never true     Ran Out of Food in the Last Year: Never true  Transportation Needs: No Transportation Needs (03/15/2022)   PRAPARE - Administrator, Civil Service (Medical): No    Lack of Transportation (Non-Medical): No  Physical Activity: Inactive (03/15/2022)   Exercise Vital Sign    Days of Exercise per Week: 0 days    Minutes of Exercise per Session: 0 min  Stress: Not on file  Social Connections: Not on file     Family History: The patient's family history includes Anxiety disorder in her brother and mother; Bipolar disorder in her mother; COPD in her father; Depression in her brother; Diverticulitis in her mother; Hypertension in her mother; Other in her father; Post-traumatic stress disorder in her brother; Schizophrenia in her maternal grandfather and mother. There is no history of Suicidality, Colon cancer, or Esophageal cancer.  ROS:   Please see the history of present illness.    All other systems reviewed and are negative.  EKGs/Labs/Other Studies Reviewed:    Echocardiogram  06/25/2021:  1. Left ventricular ejection fraction, by estimation, is 60 to 65%. The  left ventricle has normal function. The left ventricle has no regional  wall motion abnormalities. Left ventricular diastolic parameters were  normal.   2. Right ventricular systolic function is normal. The right ventricular  size is normal.   3. The mitral valve is normal in structure. No evidence of mitral valve  regurgitation. No evidence of mitral stenosis.   4. The aortic valve is normal in structure. Aortic valve regurgitation is  not visualized. No aortic stenosis is present.   5. The inferior vena cava is normal in size with greater than 50%  respiratory variability, suggesting right atrial pressure of 3 mmHg.   CTA Chest  05/08/2021: IMPRESSION: 1. No segmental or larger pulmonary embolus. 2. No acute thoracic abnormality.  EKG:  EKG is personally reviewed. 07/05/2022:  EKG was not ordered. 03/15/2022:  Sinus rhythm. Rate 97 bpm. PVCs.    Recent Labs: 03/16/2022: ALT 11; BUN 4; Creatinine, Ser 0.77; Hemoglobin 12.2; Platelets 373; Potassium 3.8; Sodium 138; TSH 1.510   Recent Lipid Panel    Component Value Date/Time   CHOL 213 (H) 03/16/2022 1112   TRIG 131 03/16/2022 1112   HDL 41 03/16/2022 1112   CHOLHDL 5.2 (H) 03/16/2022 1112   CHOLHDL 3 02/24/2017 0729   VLDL 18.6 02/24/2017 0729   LDLCALC 148 (H) 03/16/2022 1112    Physical Exam:    VS:  There were no vitals taken for this visit. , BMI There is no height or weight on file to calculate BMI. GENERAL:  Well appearing HEENT: Pupils equal round and reactive, fundi not visualized, oral mucosa unremarkable NECK:  No jugular venous distention, waveform within normal limits, carotid upstroke brisk and symmetric, no bruits, no thyromegaly LUNGS:  Clear to auscultation bilaterally HEART:  RRR.  PMI not displaced or  sustained,S1 and S2 within normal limits, no S3, no S4, no clicks, no rubs, no murmurs ABD:  Flat, positive bowel sounds normal in frequency in pitch, no bruits, no rebound, no guarding, no midline pulsatile mass, no hepatomegaly, no splenomegaly EXT:  2 plus pulses throughout, no edema, no cyanosis no clubbing SKIN:  No rashes no nodules NEURO:  Cranial nerves II through XII grossly intact, motor grossly intact throughout PSYCH:  Cognitively intact, oriented to person place and time   ASSESSMENT/PLAN:    No problem-specific Assessment & Plan notes found for this encounter.    Screening for Secondary Hypertension:     03/15/2022   10:45 AM  Causes  Drugs/Herbals Screened     - Comments No caffeine.  Rare EtOH.  No tobacco.  Ibuprofen 3 days per month.  Sleep Apnea Screened     - Comments check sleep study    Relevant Labs/Studies:    Latest Ref Rng & Units 03/16/2022   11:12 AM 06/16/2021   11:05 AM 05/14/2021    6:26 AM  Basic Labs  Sodium 134 - 144 mmol/L 138  140  138   Potassium 3.5 - 5.2 mmol/L 3.8   4.6  4.5   Creatinine 0.57 - 1.00 mg/dL 4.09  8.11  9.14        Latest Ref Rng & Units 03/16/2022   11:12 AM 05/08/2021    2:54 PM  Thyroid   TSH 0.450 - 4.500 uIU/mL 1.510  1.210      Disposition:    FU with Raiford Fetterman C. Duke Salvia, MD, [facility] in 4 months.  Medication Adjustments/Labs and Tests Ordered: Current medicines are reviewed at length with the patient today.  Concerns regarding medicines are outlined above.   No orders of the defined types were placed in this encounter.  No orders of the defined types were placed in this encounter.  I,Mathew Stumpf,acting as a Neurosurgeon for Chilton Si, MD.,have documented all relevant documentation on the behalf of Chilton Si, MD,as directed by  Chilton Si, MD while in the presence of Chilton Si, MD.  I, Hedy Garro C. Duke Salvia, MD have reviewed all documentation for this visit.  The documentation of the exam, diagnosis, procedures, and orders on 11/04/2022 are all accurate and complete.  Signed, Chilton Si, MD  11/04/2022 8:02 AM    [facility]

## 2022-11-04 NOTE — Progress Notes (Signed)
Advanced Hypertension Clinic Follow-up Assessment:    Date:  11/04/2022   ID:  Dana Todd, DOB 07/05/1985, MRN [id number]  PCP:  Leilani Able, MD  Cardiologist:  Christell Constant, MD   Referring MD: Leilani Able, MD   CC: Hypertension  History of Present Illness:    Dana Todd is a 37 y.o. female with a hx of hypertension, PVCs, OSA on CPAP, GERD, anxiety, and depression, here for follow-up. She was initially seen 03/15/2022 in the Advanced Hypertension Clinic. She last saw Dr. Izora Ribas 06/2021 for a murmur and shortness of breath. She had an Echo that revealed 60-65% and normal diastolic function. Blood pressure at that time was 118/72. She reported palpitations and a ZIO monitor was ordered but not completed. She saw Dr. Cherly Hensen 12/2021 and her blood pressure was 138/100 on HCTZ, so she was referred to the Advanced Hypertension Clinic.  She was diagnosed with hypertension at 37 years old, which she attributed to significant work stress at the time. 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 Occasional episodes of palpitations lasting up to 20 minutes ever since her hysteroscopy with tubal ligation 04/2021. She reported weight gain from 130 to 237 lbs over a few years. Kidney and liver function were normal. LDL was elevated at 148. She was referred to [facility] and the [facility]. She was referred for a sleep study. Her blood pressure was 112/86 and no changes were made to her medication.  At her appointment 02/2023 she was feeling better and blood pressures are better controlled.  She had a sleep study 08/2022 that was positive for sleep apnea and a CPAP titration was ordered.  Today, she is accompanied by her husband. She has been monitoring her blood pressure at home which is typically well controlled in the 120s/80s. She also attributes this to monitoring her  sodium intake and working on her stress levels. She is now puling herself out of stressful situations. However she has gained some weight which she attributes mostly to her stress. She continues to work on weight loss. Since a [employer] is nearby she has been going more frequently and uses the stair machine for 4 minutes. She tries to exercise at least 30 minutes a day. She is still struggling with elevated heart rates, both at rest and while exercising. She has her smart watch set to alert her when her heart rate reaches 189 bpm. She then stops and rests so her heart rate will lower. She notes that she is able to walk for an hour at a steady pace. Once her heart rate goes up she has to stop and rest, and sometimes can't continue due to fatigue. While sitting down at a funeral yesterday her heart rate was 139 bpm during an emotional time. Usually she only takes anxiety medications with travelling, but she may try to take this more consistently in case her elevated heart rates are related to the anxiety. She is using her CPAP machine. Recently she forgot to use it for one night, and noticed her heart rate increased the next morning which hadn't occurred for a while. She denies any chest pain, shortness of breath, or peripheral edema. No lightheadedness, headaches, syncope, orthopnea, or PND.   Past Medical History:  Diagnosis Date   Anxiety    Depression    DOE (dyspnea on exertion)    Essential hypertension  03/15/2022   GERD (gastroesophageal reflux disease)    Inappropriate sinus tachycardia 11/04/2022   OSA on CPAP 11/04/2022   PVC's (premature ventricular contractions) 03/15/2022   Retained intrauterine contraceptive device (IUD) 04/07/2021   Wears partial dentures    upper    Past Surgical History:  Procedure Laterality Date   HYSTEROSCOPY WITH RESECTOSCOPE N/A 04/17/2021   Procedure: HYSTEROSCOPY;  Surgeon: Maxie Better, MD;  Location: [facility];  Service:  Gynecology;  Laterality: N/A;   TUBAL LIGATION Bilateral 04/17/2021   Procedure: BILATERAL TUBAL LIGATION;  Surgeon: Maxie Better, MD;  Location: [facility];  Service: Gynecology;  Laterality: Bilateral;    Current Medications: Current Meds  Medication Sig   amLODipine (NORVASC) 2.5 MG tablet Take 1 tablet (2.5 mg total) by mouth daily.   cholecalciferol (VITAMIN D) 1000 units tablet Take 1,000 Units by mouth daily.   hydrOXYzine (ATARAX) 25 MG tablet Take 1 tablet (25 mg total) by mouth every 8 (eight) hours as needed for anxiety.   ibuprofen (ADVIL) 800 MG tablet Take 1 tablet (800 mg total) by mouth every 8 (eight) hours as needed.   metoprolol succinate (TOPROL XL) 25 MG 24 hr tablet Take 1 tablet (25 mg total) by mouth daily.   Multiple Vitamins-Minerals (MULTIVITAMIN WITH MINERALS) tablet Take 1 tablet by mouth daily. Mary ruth multivitamin daily with vitamin e   pantoprazole (PROTONIX) 40 MG tablet Take 1 tablet (40 mg total) by mouth daily.   [DISCONTINUED] hydrochlorothiazide (HYDRODIURIL) 25 MG tablet Take 25 mg by mouth daily.     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 Grew up in [place] and was raised by mom. Has 2 brother and pt is middle child. Pt completed  3 yrs of chemisty in college.    Social Determinants of Health   Financial Resource Strain: Low Risk  (03/15/2022)   Overall Financial Resource Strain (CARDIA)    Difficulty of Paying Living Expenses: Not hard at all  Food Insecurity: No Food Insecurity (03/15/2022)   Hunger Vital Sign    Worried About Running Out of Food in the Last Year: Never true    Ran Out of Food in the Last Year: Never true  Transportation Needs: No Transportation Needs (03/15/2022)   PRAPARE - Administrator, Civil Service (Medical): No    Lack of Transportation (Non-Medical): No  Physical Activity: Inactive (03/15/2022)   Exercise Vital Sign    Days of Exercise per Week: 0 days    Minutes of Exercise per Session: 0 min  Stress: Not on file  Social Connections: Not on file  Family History: The patient's family history includes Anxiety disorder in her brother and mother; Bipolar disorder in her mother; COPD in her father; Depression in her brother; Diverticulitis in her mother; Hypertension in her mother; Other in her father; Post-traumatic stress disorder in her brother; Schizophrenia in her maternal grandfather and mother. There is no history of Suicidality, Colon cancer, or Esophageal cancer.  ROS:   Please see the history of present illness.    (+) Palpitations (+) Stress (+) Anxiety All other systems reviewed and are negative.  EKGs/Labs/Other Studies Reviewed:    Echocardiogram  06/25/2021:  1. Left ventricular ejection fraction, by estimation, is 60 to 65%. The  left ventricle has normal function. The left ventricle has no regional  wall motion abnormalities. Left ventricular diastolic parameters were  normal.   2. Right ventricular systolic function is normal. The right ventricular  size is normal.   3. The mitral valve is normal in structure. No evidence of mitral valve  regurgitation. No evidence of mitral stenosis.   4. The aortic valve is normal in structure. Aortic valve  regurgitation is  not visualized. No aortic stenosis is present.   5. The inferior vena cava is normal in size with greater than 50%  respiratory variability, suggesting right atrial pressure of 3 mmHg.   CTA Chest  05/08/2021: IMPRESSION: 1. No segmental or larger pulmonary embolus. 2. No acute thoracic abnormality.  EKG:  EKG is personally reviewed. 11/04/2022:  EKG was not ordered. 07/05/2022:  EKG was not ordered. 03/15/2022: Sinus rhythm. Rate 97 bpm. PVCs.    Recent Labs: 03/16/2022: ALT 11; BUN 4; Creatinine, Ser 0.77; Hemoglobin 12.2; Platelets 373; Potassium 3.8; Sodium 138; TSH 1.510   Recent Lipid Panel    Component Value Date/Time   CHOL 213 (H) 03/16/2022 1112   TRIG 131 03/16/2022 1112   HDL 41 03/16/2022 1112   CHOLHDL 5.2 (H) 03/16/2022 1112   CHOLHDL 3 02/24/2017 0729   VLDL 18.6 02/24/2017 0729   LDLCALC 148 (H) 03/16/2022 1112    Physical Exam:    VS:  BP 115/81 (BP Location: Left Arm, Patient Position: Sitting, Cuff Size: Large)   Pulse (!) 109   Ht 5' (1.524 m)   Wt 231 lb 11.2 oz (105.1 kg)   SpO2 100%   BMI 45.25 kg/m  , BMI Body mass index is 45.25 kg/m. GENERAL:  Well appearing HEENT: Pupils equal round and reactive, fundi not visualized, oral mucosa unremarkable NECK:  No jugular venous distention, waveform within normal limits, carotid upstroke brisk and symmetric, no bruits, no thyromegaly LUNGS:  Clear to auscultation bilaterally HEART:  RRR.  PMI not displaced or sustained,S1 and S2 within normal limits, no S3, no S4, no clicks, no rubs, no murmurs ABD:  Flat, positive bowel sounds normal in frequency in pitch, no bruits, no rebound, no guarding, no midline pulsatile mass, no hepatomegaly, no splenomegaly EXT:  2 plus pulses throughout, no edema, no cyanosis no clubbing SKIN:  No rashes no nodules NEURO:  Cranial nerves II through XII grossly intact, motor grossly intact throughout PSYCH:  Cognitively intact, oriented to person place and  time   ASSESSMENT/PLAN:    No problem-specific Assessment & Plan notes found for this encounter.  # Hypertension:   - Blood pressure well-controlled with home monitoring in 120s/80s range. - Continue hydrochlorothiazide and home monitoring. - She was congratulated on her regular exercise - Follow up in 2 months.  # Tachycardia: - History of fast heart rate and anxiety.  No evidence of HF on her echo. - Start metoprolol 25mg  daily for rate control. - Continue working on Primary school teacher  # Weight management: - Focusing on weight loss journey with gym routine.   - Encourage continued efforts on weight loss, exercise, stress management. - Reassess progress in 2 months.  # Sleep apnea:  - Using CPAP, notices correlation with heart rate control. - Encourage consistent CPAP use and healthy weight for sleep apnea.  # Anxiety: - History of anxiety, taking hydroxyzine prn. - Discuss anxiety management plan with PCP, consider adjusting hydroxyzine frequency. - Continue stress management techniques.  Follow up in 2 months to assess metoprolol response and adjust treatment plan as needed.   Screening for Secondary Hypertension:     03/15/2022   10:45 AM  Causes  Drugs/Herbals Screened     - Comments No caffeine.  Rare EtOH.  No tobacco.  Ibuprofen 3 days per month.  Sleep Apnea Screened     - Comments check sleep study    Relevant Labs/Studies:    Latest Ref Rng & Units 03/16/2022   11:12 AM 06/16/2021   11:05 AM 05/14/2021    6:26 AM  Basic Labs  Sodium 134 - 144 mmol/L 138  140  138   Potassium 3.5 - 5.2 mmol/L 3.8  4.6  4.5   Creatinine 0.57 - 1.00 mg/dL 1.61  0.96  0.45        Latest Ref Rng & Units 03/16/2022   11:12 AM 05/08/2021    2:54 PM  Thyroid   TSH 0.450 - 4.500 uIU/mL 1.510  1.210      Disposition:    FU with APP in 2 months.   Medication Adjustments/Labs and Tests Ordered: Current medicines are reviewed at length with the patient today.  Concerns  regarding medicines are outlined above.   No orders of the defined types were placed in this encounter.  Meds ordered this encounter  Medications   metoprolol succinate (TOPROL XL) 25 MG 24 hr tablet    Sig: Take 1 tablet (25 mg total) by mouth daily.    Dispense:  90 tablet    Refill:  3   hydrochlorothiazide (HYDRODIURIL) 25 MG tablet    Sig: Take 1 tablet (25 mg total) by mouth daily.    Dispense:  90 tablet    Refill:  3   I,Mathew Stumpf,acting as a scribe for Chilton Si, MD.,have documented all relevant documentation on the behalf of Chilton Si, MD,as directed by  Chilton Si, MD while in the presence of Chilton Si, MD.  I, Dawsyn Ramsaran C. Duke Salvia, MD have reviewed all documentation for this visit.  The documentation of the exam, diagnosis, procedures, and orders on 11/04/2022 are all accurate and complete.  Signed, Chilton Si, MD  11/04/2022 12:20 PM    [facility]

## 2022-11-11 ENCOUNTER — Ambulatory Visit: Payer: 59 | Attending: Cardiology | Admitting: Cardiology

## 2022-11-11 ENCOUNTER — Encounter: Payer: Self-pay | Admitting: Cardiology

## 2022-11-11 VITALS — BP 138/80 | HR 90 | Ht 60.0 in | Wt 233.6 lb

## 2022-11-11 DIAGNOSIS — G4733 Obstructive sleep apnea (adult) (pediatric): Secondary | ICD-10-CM

## 2022-11-11 DIAGNOSIS — I10 Essential (primary) hypertension: Secondary | ICD-10-CM

## 2022-11-11 MED ORDER — AMLODIPINE BESYLATE 2.5 MG PO TABS: 2.5000 mg | ORAL_TABLET | Freq: Every day | ORAL | 3 refills | Status: DC

## 2022-11-11 NOTE — Addendum Note (Signed)
Addended by: Luellen Pucker on: 11/11/2022 09:14 AM   Modules accepted: Orders

## 2022-11-11 NOTE — Patient Instructions (Signed)
Medication Instructions:  Your physician recommends that you continue on your current medications as directed. Please refer to the Current Medication list given to you today.  *If you need a refill on your cardiac medications before your next appointment, please call your pharmacy*   Lab Work: None.  If you have labs (blood work) drawn today and your tests are completely normal, you will receive your results only by: MyChart Message (if you have MyChart) OR A paper copy in the mail If you have any lab test that is abnormal or we need to change your treatment, we will call you to review the results.   Testing/Procedures: None.   Follow-Up: At [facility], you and your health needs are our priority.  As part of our continuing mission to provide you with exceptional heart care, we have created designated Provider Care Teams.  These Care Teams include your primary Cardiologist (physician) and Advanced Practice Providers (APPs -  Physician Assistants and Nurse Practitioners) who all work together to provide you with the care you need, when you need it.  We recommend signing up for the patient portal called "MyChart".  Sign up information is provided on this After Visit Summary.  MyChart is used to connect with patients for Virtual Visits (Telemedicine).  Patients are able to view lab/test results, encounter notes, upcoming appointments, etc.  Non-urgent messages can be sent to your provider as well.   To learn more about what you can do with MyChart, go to [email or web address].    Your next appointment:   1 year(s)  Provider:   Dr. Traci Turner, MD   

## 2022-11-11 NOTE — Progress Notes (Signed)
Sleep Medicine CONSULT Note    Date:  11/11/2022   ID:  Harlen Labs, DOB 1985/09/05, MRN [id number]  PCP:  Leilani Able, MD  Cardiologist: Christell Constant, MD   Chief Complaint  Patient presents with   New Patient (Initial Visit)    OSA    History of Present Illness:  Dana Todd is a 37 y.o. female who is being seen today for the evaluation of OSA at the request of Chilton Si, MD.  This is a 37 year old African-American female with a history of morbid obesity, depression, anxiety, hypertension, GERD, PVCs who is followed by Dr. Duke Salvia.  Due to difficulty in controlling her blood pressure a home sleep study was ordered.  Sleep study showed moderate obstructive sleep apnea with an AHI of 25.6/h and no central events.  She was started on auto CPAP from 4 to 15 cm H2O.  She is referred for sleep medicine consultation to establish sleep care.  She tells me that prior to CPAP she would have nonrestorative sleep and snore.  She has been told that she stops breathing in her sleep.  Stop Bang score is 5.  She is doing well with his PAP device and thinks that she has gotten used to it.  She tolerates the nasal pillow mas.  She tried the [facility] but had bruising over the bridge of her nose. She feels the pressure is adequate.  Since going on PAP she feels rested in the am and has no significant daytime sleepiness.  She denies any significant mouth or nasal dryness or nasal congestion.  She does not think that he snores.    Past Medical History:  Diagnosis Date   Anxiety    Depression    DOE (dyspnea on exertion)    Essential hypertension 03/15/2022   GERD (gastroesophageal reflux disease)    Inappropriate sinus tachycardia 11/04/2022   OSA on CPAP 11/04/2022   PVC's (premature ventricular contractions) 03/15/2022   Retained intrauterine contraceptive device (IUD) 04/07/2021   Wears partial dentures    upper    Past Surgical History:  Procedure  Laterality Date   HYSTEROSCOPY WITH RESECTOSCOPE N/A 04/17/2021   Procedure: HYSTEROSCOPY;  Surgeon: Maxie Better, MD;  Location: [facility] [place];  Service: Gynecology;  Laterality: N/A;   TUBAL LIGATION Bilateral 04/17/2021   Procedure: BILATERAL TUBAL LIGATION;  Surgeon: Maxie Better, MD;  Location: [facility] [place];  Service: Gynecology;  Laterality: Bilateral;    Current Medications: Current Meds  Medication Sig   amLODipine (NORVASC) 2.5 MG tablet Take 1 tablet (2.5 mg total) by mouth Todd.   cholecalciferol (VITAMIN D) 1000 units tablet Take 1,000 Units by mouth Todd.   hydrochlorothiazide (HYDRODIURIL) 25 MG tablet Take 1 tablet (25 mg total) by mouth Todd.   hydrOXYzine (ATARAX) 25 MG tablet Take 1 tablet (25 mg total) by mouth every 8 (eight) hours as needed for anxiety.   ibuprofen (ADVIL) 800 MG tablet Take 1 tablet (800 mg total) by mouth every 8 (eight) hours as needed.   metoprolol succinate (TOPROL XL) 25 MG 24 hr tablet Take 1 tablet (25 mg total) by mouth Todd.   Multiple Vitamins-Minerals (MULTIVITAMIN WITH MINERALS) tablet Take 1 tablet by mouth Todd. Mary ruth multivitamin Todd with vitamin e    Allergies:   Gelatin, Other, and Pork-derived products   Social History   Socioeconomic History   Marital status: Single    Spouse name: Not on file   Number of  children: 0   Years of education: 15   Highest education level: Not on file  Occupational History   Occupation: Scientist, product/process development support    Comment: At home for [employer]  Tobacco Use   Smoking status: Never   Smokeless tobacco: Never  Vaping Use   Vaping Use: Never used  Substance and Sexual Activity   Alcohol use: Yes    Comment: occ   Drug use: No   Sexual activity: Not on file  Other Topics Concern   Not on file  Social History Narrative   Lives in [place], married with spouse , has one dog.  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 Grew up in [place] and was raised by mom. Has 2 brother and pt is middle child. 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    Social Determinants of Health   Financial Resource Strain: Low Risk  (03/15/2022)   Overall Financial Resource Strain (CARDIA)    Difficulty of Paying Living Expenses: Not hard at all  Food Insecurity: No Food Insecurity (03/15/2022)   Hunger Vital Sign    Worried About Running Out of Food in the Last Year: Never true    Ran Out of Food in the Last Year: Never true  Transportation Needs: No Transportation Needs (03/15/2022)   PRAPARE - Administrator, Civil Service (Medical): No    Lack of Transportation (Non-Medical): No  Physical Activity: Inactive (03/15/2022)   Exercise Vital Sign    Days of Exercise per Week: 0 days    Minutes of Exercise per Session: 0 min  Stress: Not on file  Social Connections: Not on file     Family History:  The patient's family history includes Anxiety disorder in her brother and mother; Bipolar disorder in her mother; COPD in her father; Depression in her brother; Diverticulitis in her mother; Hypertension in her mother; Other in her father; Post-traumatic stress disorder in her brother; Schizophrenia in her maternal grandfather and mother.   ROS:   Please see the history of present illness.    ROS All other systems reviewed and are negative.      No data to display             PHYSICAL EXAM:   VS:  BP 138/80   Pulse 90   Ht 5' (1.524 m)   Wt 233 lb 9.6 oz (106 kg)   SpO2 99%   BMI 45.62 kg/m    GEN: Well nourished, well developed, in no acute distress  HEENT: normal  Neck: no JVD, carotid bruits, or masses Cardiac: RRR; no murmurs, rubs, or gallops,no edema.  Intact distal pulses bilaterally.  Respiratory:  clear to auscultation bilaterally, normal work of breathing GI: soft, nontender, nondistended, + BS MS: no deformity or atrophy  Skin: warm and dry, no rash Neuro:  Alert and Oriented x 3,  Strength and sensation are intact Psych: euthymic mood, full affect  Wt Readings from Last 3 Encounters:  11/11/22 233 lb 9.6 oz (106 kg)  11/04/22 231 lb 11.2 oz (105.1 kg)  07/05/22 238 lb 12.8 oz (108.3 kg)      Studies/Labs Reviewed:   HST and PAP compliance download  Recent Labs: 03/16/2022: ALT 11; BUN 4; Creatinine, Ser 0.77; Hemoglobin 12.2; Platelets 373; Potassium 3.8; Sodium 138; TSH 1.510    Additional studies/ records that were reviewed today include:  none    ASSESSMENT:    1. OSA  on CPAP   2. Essential hypertension      PLAN:  In order of problems listed above:  OSA - The patient is tolerating PAP therapy well without any problems. The PAP download performed by his DME was personally reviewed and interpreted by me today and showed an AHI of 0.6/hr on auto CPAP from 4 to 15 cm H2O with 70% compliance in using more than 4 hours nightly.  The patient has been using and benefiting from PAP use and will continue to benefit from therapy.   2.  HTN -BP controlled on exam today -continue prescription drug management with Amlodipine 2.5mg  Todd, hCTZ 25mg  Todd, Toprol ZL 25mg  Todd with PRN refills.  3.  Morbid Obesity -she is getting in to see a nutrition specialist per Dr. Duke Salvia   Medication Adjustments/Labs and Tests Ordered: Current medicines are reviewed at length with the patient today.  Concerns regarding medicines are outlined above.  Medication changes, Labs and Tests ordered today are listed in the Patient Instructions below.  There are no Patient Instructions on file for this visit.   Signed, Armanda Magic, MD  11/11/2022 9:06 AM    [facility] [address] [place], [place], [place]  [address] Phone: [phone]; Fax: [phone]

## 2022-12-22 ENCOUNTER — Other Ambulatory Visit (HOSPITAL_BASED_OUTPATIENT_CLINIC_OR_DEPARTMENT_OTHER): Payer: Self-pay

## 2022-12-22 ENCOUNTER — Other Ambulatory Visit: Payer: Self-pay

## 2022-12-22 ENCOUNTER — Emergency Department (HOSPITAL_COMMUNITY)
Admission: EM | Admit: 2022-12-22 | Discharge: 2022-12-23 | Discharge status: Against Medical Advice | Payer: 59 | Attending: Emergency Medicine | Admitting: Emergency Medicine

## 2022-12-22 ENCOUNTER — Emergency Department (HOSPITAL_COMMUNITY): Payer: 59

## 2022-12-22 ENCOUNTER — Encounter (HOSPITAL_COMMUNITY): Payer: Self-pay

## 2022-12-22 DIAGNOSIS — R0602 Shortness of breath: Secondary | ICD-10-CM | POA: Insufficient documentation

## 2022-12-22 DIAGNOSIS — E860 Dehydration: Secondary | ICD-10-CM | POA: Diagnosis not present

## 2022-12-22 DIAGNOSIS — Z5321 Procedure and treatment not carried out due to patient leaving prior to being seen by health care provider: Secondary | ICD-10-CM | POA: Insufficient documentation

## 2022-12-22 DIAGNOSIS — Z1152 Encounter for screening for COVID-19: Secondary | ICD-10-CM | POA: Diagnosis not present

## 2022-12-22 DIAGNOSIS — R000 Tachycardia, unspecified: Secondary | ICD-10-CM | POA: Diagnosis not present

## 2022-12-22 LAB — CBC
HCT: 38.1 % (ref 36.0–46.0)
Hemoglobin: 12.3 g/dL (ref 12.0–15.0)
MCH: 28.1 pg (ref 26.0–34.0)
MCHC: 32.3 g/dL (ref 30.0–36.0)
MCV: 87 fL (ref 80.0–100.0)
Platelets: 370 10*3/uL (ref 150–400)
RBC: 4.38 MIL/uL (ref 3.87–5.11)
RDW: 15.8 % — ABNORMAL HIGH (ref 11.5–15.5)
WBC: 15.3 10*3/uL — ABNORMAL HIGH (ref 4.0–10.5)
nRBC: 0 % (ref 0.0–0.2)

## 2022-12-22 LAB — COMPREHENSIVE METABOLIC PANEL
ALT: 11 U/L (ref 0–44)
AST: 15 U/L (ref 15–41)
Albumin: 3.4 g/dL — ABNORMAL LOW (ref 3.5–5.0)
Alkaline Phosphatase: 75 U/L (ref 38–126)
Anion gap: 10 (ref 5–15)
BUN: 5 mg/dL — ABNORMAL LOW (ref 6–20)
CO2: 25 mmol/L (ref 22–32)
Calcium: 9 mg/dL (ref 8.9–10.3)
Chloride: 98 mmol/L (ref 98–111)
Creatinine, Ser: 0.86 mg/dL (ref 0.44–1.00)
GFR, Estimated: >60 mL/min (ref >60)
Glucose, Bld: 154 mg/dL — ABNORMAL HIGH (ref 70–99)
Potassium: 3.1 mmol/L — ABNORMAL LOW (ref 3.5–5.1)
Sodium: 133 mmol/L — ABNORMAL LOW (ref 135–145)
Total Bilirubin: 0.5 mg/dL (ref 0.3–1.2)
Total Protein: 8.3 g/dL — ABNORMAL HIGH (ref 6.5–8.1)

## 2022-12-22 LAB — RESP PANEL BY RT-PCR (RSV, FLU A&B, COVID)  RVPGX2
Influenza A by PCR: NEGATIVE
Influenza B by PCR: NEGATIVE
Resp Syncytial Virus by PCR: NEGATIVE
SARS Coronavirus 2 by RT PCR: NEGATIVE

## 2022-12-22 MED ORDER — WEGOVY 0.25 MG/0.5ML ~~LOC~~ SOAJ
0.2500 mg | SUBCUTANEOUS | 0 refills | Status: DC
  Filled 2022-12-22 (×2): qty 2, 28d supply, fill #0

## 2022-12-22 MED ORDER — ONDANSETRON 4 MG PO TBDP
4.0000 mg | ORAL_TABLET | Freq: Once | ORAL | Status: AC
  Administered 2022-12-22: 4 mg via ORAL
  Filled 2022-12-22: qty 1

## 2022-12-22 NOTE — ED Provider Triage Note (Signed)
Emergency Medicine Provider Triage Evaluation Note  Dana Todd , a 37 y.o. female  was evaluated in triage.  Pt complains of fast heart rate and shortness of breath since about 8 PM this evening.  She denies any fevers, chills, cough, sore throat.  Was around sick kids yesterday.  Denies any long plane or car rides, recent surgeries, history of malignancy.  She feels she is dehydrated.  Review of Systems  Positive: As above Negative: As above  Physical Exam  BP (!) 147/67   Pulse (!) 156   Temp 99.7 F (37.6 C)   Resp (!) 21   Ht 5' (1.524 m)   Wt 104.3 kg   LMP  (LMP Unknown)   SpO2 100%   BMI 44.92 kg/m  Gen:   Awake, no distress   Resp:  Normal effort  MSK:   Moves extremities without difficulty  Other:  Tachycardia Lungs clear to auscultation bilaterally  Medical Decision Making  Medically screening exam initiated at 9:25 PM.  Appropriate orders placed.  Sujey Gundry was informed that the remainder of the evaluation will be completed by another provider, this initial triage assessment does not replace that evaluation, and the importance of remaining in the ED until their evaluation is complete.     Arabella Merles, PA-C 12/22/22 2136

## 2022-12-22 NOTE — ED Triage Notes (Signed)
Says she was seen today at [facility] and referred for a weight loss.   Was driving and noticed hr at 150.   2 episodes of vomiting prior to arrival.

## 2022-12-23 ENCOUNTER — Other Ambulatory Visit (HOSPITAL_BASED_OUTPATIENT_CLINIC_OR_DEPARTMENT_OTHER): Payer: Self-pay

## 2022-12-24 ENCOUNTER — Telehealth (HOSPITAL_BASED_OUTPATIENT_CLINIC_OR_DEPARTMENT_OTHER): Payer: Self-pay | Admitting: Cardiovascular Disease

## 2022-12-24 ENCOUNTER — Other Ambulatory Visit (HOSPITAL_BASED_OUTPATIENT_CLINIC_OR_DEPARTMENT_OTHER): Payer: Self-pay

## 2022-12-24 NOTE — Telephone Encounter (Signed)
Spoke with patient regarding the referral from Dr. Duke Salvia to [facility]---patient states she was actually seen at [facility] is waiting for the insurance prior authorization for the prescribed medication and Dr. Duke Salvia should be receiving a consult note from them

## 2022-12-28 ENCOUNTER — Other Ambulatory Visit (HOSPITAL_BASED_OUTPATIENT_CLINIC_OR_DEPARTMENT_OTHER): Payer: Self-pay

## 2022-12-31 ENCOUNTER — Other Ambulatory Visit (HOSPITAL_BASED_OUTPATIENT_CLINIC_OR_DEPARTMENT_OTHER): Payer: Self-pay

## 2023-01-03 ENCOUNTER — Other Ambulatory Visit (HOSPITAL_BASED_OUTPATIENT_CLINIC_OR_DEPARTMENT_OTHER): Payer: Self-pay

## 2023-01-04 ENCOUNTER — Other Ambulatory Visit (HOSPITAL_BASED_OUTPATIENT_CLINIC_OR_DEPARTMENT_OTHER): Payer: Self-pay

## 2023-01-06 ENCOUNTER — Ambulatory Visit (INDEPENDENT_AMBULATORY_CARE_PROVIDER_SITE_OTHER): Payer: 59 | Admitting: Family

## 2023-01-06 ENCOUNTER — Other Ambulatory Visit (HOSPITAL_BASED_OUTPATIENT_CLINIC_OR_DEPARTMENT_OTHER): Payer: Self-pay

## 2023-01-06 ENCOUNTER — Encounter (HOSPITAL_BASED_OUTPATIENT_CLINIC_OR_DEPARTMENT_OTHER): Payer: Self-pay | Admitting: Family

## 2023-01-06 VITALS — BP 127/86 | HR 108 | Ht 60.0 in | Wt 232.0 lb

## 2023-01-06 DIAGNOSIS — G4733 Obstructive sleep apnea (adult) (pediatric): Secondary | ICD-10-CM

## 2023-01-06 DIAGNOSIS — I4711 Inappropriate sinus tachycardia, so stated: Secondary | ICD-10-CM | POA: Diagnosis not present

## 2023-01-06 DIAGNOSIS — I493 Ventricular premature depolarization: Secondary | ICD-10-CM

## 2023-01-06 DIAGNOSIS — Z6841 Body Mass Index (BMI) 40.0 and over, adult: Secondary | ICD-10-CM

## 2023-01-06 DIAGNOSIS — I10 Essential (primary) hypertension: Secondary | ICD-10-CM | POA: Diagnosis not present

## 2023-01-06 NOTE — Patient Instructions (Signed)
Medication Instructions:  Continue your current medications.   If you have worsening palpitations we can consider increasing your dose of Metoprolol.   We will resubmit prior authorization for Wegovy   Follow-Up: In Advanced Hypertension 4-6 months with Dr. Duke Salvia

## 2023-01-06 NOTE — Progress Notes (Signed)
Advanced Hypertension Clinic Assessment:    Date:  01/06/2023   ID:  Harlen Labs, DOB 1986-01-28, MRN [id number]  PCP:  Leilani Able, MD  Cardiologist:  Christell Constant, MD  Nephrologist:  Referring MD: Leilani Able, MD   CC: Hypertension  History of Present Illness:    Dana Todd is a 37 y.o. female with a hx of HTN, PVC, OSA on CPAP, GERD, anxiety, depression.   Established with Advanced Hypertension Clinic 03/15/22. Prior evaluation 06/2021 with Dr. Izora Ribas for murmur and dyspnea. Echo LVEF 60-65%, normal diastolic function. ZIO ordered but not completed.   Diagnosed with hypertension at 37 years old which she attributed to stress. Ongoing palpitations up to 20 minutes since her hysterescopy with tubal ligation 04/2021. Referred to [facility] exercise program, [facility]. Sleep study 08/2022 positive for sleep apnea and CPAP titration ordered.   Last seen 11/04/22 BP controlled on home monitoring. Started on Metoprolol succinate 25mg  daily for palpitations.   Presents today for follow up. Palpitations much improved since adding Metoprolol. Monitors heart rate with Apple Watch. Established with weight loss clinic at [facility] [place]) and was prescribed HQIONG but per her report needs documentation of cardiovascular risk factors. BP at home has been 128/83. Walking for exercise at the gym.   Previous antihypertensives:   Past Medical History:  Diagnosis Date   Anxiety    Depression    DOE (dyspnea on exertion)    Essential hypertension 03/15/2022   GERD (gastroesophageal reflux disease)    Inappropriate sinus tachycardia 11/04/2022   OSA on CPAP 11/04/2022   PVC's (premature ventricular contractions) 03/15/2022   Retained intrauterine contraceptive device (IUD) 04/07/2021   Wears partial dentures    upper    Past Surgical History:  Procedure Laterality Date   HYSTEROSCOPY WITH RESECTOSCOPE N/A 04/17/2021   Procedure:  HYSTEROSCOPY;  Surgeon: Maxie Better, MD;  Location: [facility];  Service: Gynecology;  Laterality: N/A;   TUBAL LIGATION Bilateral 04/17/2021   Procedure: BILATERAL TUBAL LIGATION;  Surgeon: Maxie Better, MD;  Location: [facility];  Service: Gynecology;  Laterality: Bilateral;    Current Medications: Current Meds  Medication Sig   amLODipine (NORVASC) 2.5 MG tablet Take 1 tablet (2.5 mg total) by mouth daily.   cholecalciferol (VITAMIN D) 1000 units tablet Take 1,000 Units by mouth daily.   hydrochlorothiazide (HYDRODIURIL) 25 MG tablet Take 1 tablet (25 mg total) by mouth daily.   hydrOXYzine (ATARAX) 25 MG tablet Take 1 tablet (25 mg total) by mouth every 8 (eight) hours as needed for anxiety.   ibuprofen (ADVIL) 800 MG tablet Take 1 tablet (800 mg total) by mouth every 8 (eight) hours as needed. (Patient taking differently: Take 800 mg by mouth every 8 (eight) hours as needed for mild pain.)   metoprolol succinate (TOPROL XL) 25 MG 24 hr tablet Take 1 tablet (25 mg total) by mouth daily.   Multiple Vitamins-Minerals (MULTIVITAMIN WITH MINERALS) tablet Take 1 tablet by mouth daily.   pantoprazole (PROTONIX) 40 MG tablet Take 1 tablet (40 mg total) by mouth daily.     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 Grew up in [place] and was raised by mom. Has 2 brother and pt is middle child. 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    Social Determinants of Health   Financial Resource Strain: Low Risk  (03/15/2022)   Overall Financial Resource Strain (CARDIA)    Difficulty of Paying Living Expenses: Not hard at all  Food Insecurity: No Food Insecurity (03/15/2022)   Hunger Vital Sign    Worried About Running Out of Food in the Last Year: Never true    Ran Out of Food in the Last Year: Never true  Transportation Needs: No Transportation Needs (03/15/2022)   PRAPARE - Administrator, Civil Service (Medical): No    Lack of Transportation (Non-Medical): No  Physical Activity: Inactive (03/15/2022)   Exercise Vital Sign    Days of Exercise per Week: 0 days    Minutes of Exercise per Session: 0 min  Stress: Not on file  Social Connections: Not on file     Family History: The patient's family history includes Anxiety disorder in her brother and mother; Bipolar disorder in her mother; COPD in her father; Depression in her brother; Diverticulitis in her mother; Hypertension in her mother; Other in her father; Post-traumatic stress disorder in her brother; Schizophrenia in her maternal grandfather and mother. There is no history of Suicidality, Colon cancer, or Esophageal cancer.  ROS:   Please see the history of present illness.     All other systems reviewed and are negative.  EKGs/Labs/Other Studies Reviewed:    EKG:  EKG is not ordered today.  Recent Labs: 03/16/2022: TSH 1.510 12/22/2022: ALT 11; BUN 5; Creatinine, Ser 0.86; Hemoglobin 12.3; Platelets 370; Potassium 3.1; Sodium 133   Recent Lipid Panel    Component Value Date/Time   CHOL 213 (H) 03/16/2022 1112   TRIG 131 03/16/2022 1112   HDL 41 03/16/2022 1112   CHOLHDL 5.2 (H) 03/16/2022 1112   CHOLHDL 3 02/24/2017 0729   VLDL 18.6 02/24/2017 0729   LDLCALC  148 (H) 03/16/2022 1112    Physical Exam:   VS:  Ht 5' (1.524 m)   Wt 232 lb (105.2 kg)   LMP  (LMP Unknown)   BMI 45.31 kg/m  , BMI Body mass index is 45.31 kg/m. GENERAL:  Well appearing HEENT: Pupils equal round and reactive, fundi not visualized, oral mucosa unremarkable NECK:  No jugular venous distention, waveform within normal limits, carotid upstroke brisk and symmetric, no bruits, no thyromegaly LYMPHATICS:  No cervical adenopathy LUNGS:  Clear to auscultation bilaterally HEART:  RRR.  PMI not displaced or sustained,S1 and S2 within normal limits, no S3, no S4, no clicks, no rubs, no murmurs ABD:  Flat, positive bowel sounds normal in frequency in pitch, no bruits, no rebound, no guarding, no midline pulsatile mass, no hepatomegaly, no splenomegaly EXT:  2 plus pulses throughout, no edema, no cyanosis no clubbing SKIN:  No rashes no nodules NEURO:  Cranial nerves II through XII grossly intact, motor grossly intact throughout PSYCH:  Cognitively intact, oriented to person place and time   ASSESSMENT/PLAN:    HTN - BP well controlled. Continue current antihypertensive regimen.    PVC / Inappropriate sinus tachycardia - Improvement with Metoprolol 25mg  daily. Continue same. If palpitations worsen, she will contact us and consider increasing dose. .   Cardiovascular risk - Cardiovascular risk factors including hypertension, OSA, obesity. Would benefit from cardiovascular risk reduction of Wegovy. Will attempt to help weight loss clinic team with [facility] prior auth.   OSA - CPAP compliance encouraged. Endorses using regularly.   Anxiety - Continue to follow with PCP.   Obesity - Exercising regularly by walking at the gym and has been limiting portion sizes. Following with [facility] clinic. Plans to start [facility].   Screening for Secondary Hypertension:     03/15/2022   10:45 AM  Causes  Drugs/Herbals Screened     - Comments No caffeine.  Rare EtOH.  No  tobacco.  Ibuprofen 3 days per month.  Sleep Apnea Screened     - Comments check sleep study    Relevant Labs/Studies:    Latest Ref Rng & Units 12/22/2022    8:56 PM 03/16/2022   11:12 AM 06/16/2021   11:05 AM  Basic Labs  Sodium 135 - 145 mmol/L 133  138  140   Potassium 3.5 - 5.1 mmol/L 3.1  3.8  4.6   Creatinine 0.44 - 1.00 mg/dL 1.91  4.78  2.95        Latest Ref Rng & Units 03/16/2022   11:12 AM 05/08/2021    2:54 PM  Thyroid   TSH 0.450 - 4.500 uIU/mL 1.510  1.210                    Disposition:    FU with MD/PharmD in 4-6 months    Medication Adjustments/Labs and Tests Ordered: Current medicines are reviewed at length with the patient today.  Concerns regarding medicines are outlined above.  No orders of the defined types were placed in this encounter.  No orders of the defined types were placed in this encounter.    Signed, Alver Sorrow, NP  01/06/2023 11:15 AM    [facility]

## 2023-01-09 ENCOUNTER — Encounter (HOSPITAL_BASED_OUTPATIENT_CLINIC_OR_DEPARTMENT_OTHER): Payer: Self-pay | Admitting: Family

## 2023-01-10 ENCOUNTER — Telehealth: Payer: Self-pay

## 2023-01-10 ENCOUNTER — Other Ambulatory Visit (HOSPITAL_COMMUNITY): Payer: Self-pay

## 2023-01-10 NOTE — Telephone Encounter (Signed)
-----   Message from Nurse Lendon Collar sent at 01/10/2023  7:19 AM EDT ----- [facility] ladies,   Can we please see trial a wegovy prior auth for patient using cardiovascular risk reduction? Provider not finished if needed. ----- Message ----- From: Alver Sorrow, NP Sent: 01/09/2023   8:37 PM EDT To: Marlene Lard, RN  Trial wegovy prior auth for cardiovascular risk reduction, please.

## 2023-01-10 NOTE — Telephone Encounter (Signed)
Pharmacy Patient Advocate Encounter   Received notification from Physician's Office that prior authorization for [facility] is required/requested.   Insurance verification completed.   The patient is insured through [facility] .   Per test claim: PA required; PA submitted to [facility] via CoverMyMeds Key/confirmation #/EOC [id number] Status is pending

## 2023-01-12 NOTE — Telephone Encounter (Signed)
Pharmacy Patient Advocate Encounter  Received notification from [facility] that Prior Authorization for [facility] 0.25MG /0.5ML auto-injectors has been DENIED. Please advise how you'd like to proceed. Full denial letter will be uploaded to the media tab. See denial reason below. The requested medication is not covered because it is not on the listing or formulary of approved drugs for your plan benefit. Please discuss alternative drug therapy with your doctor/plan. The request for coverage for WEGOVY INJ 0.25MG , use as directed (2 per month), is denied. This decision is based on health plan criteria for WEGOVY INJ 0.25MG . This medicine is covered only if: All of the following: (1) You are 50 years of age or older. (2) Submission of medical records documenting you have established cardiovascular disease as evidenced by one of the following: (I) Prior myocardial infarction. (II) Prior ischemic or hemorrhagic stroke. (III) Symptomatic peripheral arterial disease evidenced by one of the following: (a) Intermittent claudication with ankle-brachial index less than 0.85 (at rest). (b) Peripheral arterial revascularization procedure. (c) Amputation due to atherosclerotic disease. (3) One of the following: (A) If you have history of myocardial infarction: (I) You are on therapy from each of the following classes unless there is a contraindication or intolerance: (a) Cholesterol lowering medication (for example: statin, proprotein convertase subtilisin/kexin type 9 inhibitor). (b) Beta blocker (they are, carvedilol, metoprolol, or bisoprolol). (c) Angiotensin-converting enzyme inhibitor, angiotensin II receptor blocker or angiotensin II receptor blocker neprilysin inhibitor. (d) Antiplatelet (for example: aspirin, clopidogrel). (B) If you have history of ischemic or hemorrhagic stroke: (I) You are on therapy from each of the following classes unless there is a contraindication or intolerance: (a)  Cholesterol lowering medication (for example: statin, proprotein convertase subtilisin/kexin type 9 inhibitor). (b) Angiotensin-converting enzyme inhibitor, angiotensin II receptor blocker or angiotensin II receptor blocker neprilysin inhibitor. (c) Antiplatelet (for example: aspirin, clopidogrel). (C) If you have history of symptomatic peripheral artery disease: (I) You are on therapy from each of the following classes unless there is a contraindication or intolerance: (a) Cholesterol lowering medication (for example: statin, proprotein convertase subtilisin/kexin type 9 inhibitor). (b) Angiotensin-converting enzyme inhibitor, angiotensin II receptor blocker or angiotensin II receptor blocker neprilysin inhibitor. (c) Antiplatelet (for example: aspirin, clopidogrel) PA #/Case ID/Reference #: [id number]

## 2023-01-13 NOTE — Telephone Encounter (Signed)
Mychart messaage sent to patient

## 2023-01-13 NOTE — Telephone Encounter (Signed)
Her insurance does not cover Reginal Lutes unless she is 35 years old, has prior stokr or heart attack. Recommend she continue to follow with weight loss clinic for non-pharmacological weight loss assistance.   Alver Sorrow, NP

## 2023-01-17 ENCOUNTER — Other Ambulatory Visit (HOSPITAL_BASED_OUTPATIENT_CLINIC_OR_DEPARTMENT_OTHER): Payer: Self-pay

## 2023-01-18 ENCOUNTER — Other Ambulatory Visit (HOSPITAL_COMMUNITY): Payer: Self-pay

## 2023-01-18 ENCOUNTER — Other Ambulatory Visit (HOSPITAL_BASED_OUTPATIENT_CLINIC_OR_DEPARTMENT_OTHER): Payer: Self-pay

## 2023-01-20 ENCOUNTER — Other Ambulatory Visit (HOSPITAL_BASED_OUTPATIENT_CLINIC_OR_DEPARTMENT_OTHER): Payer: Self-pay

## 2023-01-24 ENCOUNTER — Other Ambulatory Visit (HOSPITAL_BASED_OUTPATIENT_CLINIC_OR_DEPARTMENT_OTHER): Payer: Self-pay

## 2023-01-26 ENCOUNTER — Other Ambulatory Visit (HOSPITAL_BASED_OUTPATIENT_CLINIC_OR_DEPARTMENT_OTHER): Payer: Self-pay

## 2023-01-27 ENCOUNTER — Other Ambulatory Visit (HOSPITAL_BASED_OUTPATIENT_CLINIC_OR_DEPARTMENT_OTHER): Payer: Self-pay

## 2023-01-28 ENCOUNTER — Other Ambulatory Visit (HOSPITAL_BASED_OUTPATIENT_CLINIC_OR_DEPARTMENT_OTHER): Payer: Self-pay

## 2023-01-28 MED ORDER — WEGOVY 0.25 MG/0.5ML ~~LOC~~ SOAJ
0.2500 mg | SUBCUTANEOUS | 0 refills | Status: DC
Start: 2023-01-28 — End: 2023-05-12
  Filled 2023-01-28 – 2023-02-04 (×2): qty 2, 28d supply, fill #0

## 2023-01-31 ENCOUNTER — Other Ambulatory Visit (HOSPITAL_BASED_OUTPATIENT_CLINIC_OR_DEPARTMENT_OTHER): Payer: Self-pay

## 2023-02-01 ENCOUNTER — Other Ambulatory Visit (HOSPITAL_BASED_OUTPATIENT_CLINIC_OR_DEPARTMENT_OTHER): Payer: Self-pay

## 2023-02-04 ENCOUNTER — Other Ambulatory Visit (HOSPITAL_BASED_OUTPATIENT_CLINIC_OR_DEPARTMENT_OTHER): Payer: Self-pay

## 2023-02-08 ENCOUNTER — Other Ambulatory Visit (HOSPITAL_BASED_OUTPATIENT_CLINIC_OR_DEPARTMENT_OTHER): Payer: Self-pay

## 2023-02-09 ENCOUNTER — Other Ambulatory Visit (HOSPITAL_BASED_OUTPATIENT_CLINIC_OR_DEPARTMENT_OTHER): Payer: Self-pay

## 2023-02-10 ENCOUNTER — Other Ambulatory Visit (HOSPITAL_BASED_OUTPATIENT_CLINIC_OR_DEPARTMENT_OTHER): Payer: Self-pay

## 2023-02-10 NOTE — Telephone Encounter (Signed)
Left message to call back, patient never read mychart message

## 2023-02-14 ENCOUNTER — Other Ambulatory Visit (HOSPITAL_BASED_OUTPATIENT_CLINIC_OR_DEPARTMENT_OTHER): Payer: Self-pay

## 2023-02-15 ENCOUNTER — Other Ambulatory Visit (HOSPITAL_BASED_OUTPATIENT_CLINIC_OR_DEPARTMENT_OTHER): Payer: Self-pay

## 2023-02-16 ENCOUNTER — Other Ambulatory Visit (HOSPITAL_BASED_OUTPATIENT_CLINIC_OR_DEPARTMENT_OTHER): Payer: Self-pay

## 2023-02-17 ENCOUNTER — Other Ambulatory Visit (HOSPITAL_BASED_OUTPATIENT_CLINIC_OR_DEPARTMENT_OTHER): Payer: Self-pay

## 2023-02-21 ENCOUNTER — Other Ambulatory Visit (HOSPITAL_BASED_OUTPATIENT_CLINIC_OR_DEPARTMENT_OTHER): Payer: Self-pay

## 2023-02-22 ENCOUNTER — Other Ambulatory Visit (HOSPITAL_BASED_OUTPATIENT_CLINIC_OR_DEPARTMENT_OTHER): Payer: Self-pay

## 2023-03-02 NOTE — Telephone Encounter (Signed)
Mychart message read by patient with no response, removing from basket as no further changes needed at this time.

## 2023-03-04 ENCOUNTER — Other Ambulatory Visit (HOSPITAL_BASED_OUTPATIENT_CLINIC_OR_DEPARTMENT_OTHER): Payer: Self-pay

## 2023-03-07 ENCOUNTER — Other Ambulatory Visit (HOSPITAL_BASED_OUTPATIENT_CLINIC_OR_DEPARTMENT_OTHER): Payer: Self-pay

## 2023-03-08 ENCOUNTER — Other Ambulatory Visit (HOSPITAL_BASED_OUTPATIENT_CLINIC_OR_DEPARTMENT_OTHER): Payer: Self-pay

## 2023-03-11 ENCOUNTER — Other Ambulatory Visit (HOSPITAL_BASED_OUTPATIENT_CLINIC_OR_DEPARTMENT_OTHER): Payer: Self-pay

## 2023-03-14 ENCOUNTER — Other Ambulatory Visit (HOSPITAL_BASED_OUTPATIENT_CLINIC_OR_DEPARTMENT_OTHER): Payer: Self-pay

## 2023-03-15 ENCOUNTER — Other Ambulatory Visit (HOSPITAL_BASED_OUTPATIENT_CLINIC_OR_DEPARTMENT_OTHER): Payer: Self-pay

## 2023-03-17 ENCOUNTER — Other Ambulatory Visit (HOSPITAL_BASED_OUTPATIENT_CLINIC_OR_DEPARTMENT_OTHER): Payer: Self-pay

## 2023-03-18 ENCOUNTER — Other Ambulatory Visit (HOSPITAL_BASED_OUTPATIENT_CLINIC_OR_DEPARTMENT_OTHER): Payer: Self-pay

## 2023-03-23 ENCOUNTER — Other Ambulatory Visit (HOSPITAL_BASED_OUTPATIENT_CLINIC_OR_DEPARTMENT_OTHER): Payer: Self-pay

## 2023-03-25 ENCOUNTER — Other Ambulatory Visit (HOSPITAL_BASED_OUTPATIENT_CLINIC_OR_DEPARTMENT_OTHER): Payer: Self-pay

## 2023-03-28 ENCOUNTER — Other Ambulatory Visit (HOSPITAL_BASED_OUTPATIENT_CLINIC_OR_DEPARTMENT_OTHER): Payer: Self-pay

## 2023-03-30 ENCOUNTER — Other Ambulatory Visit (HOSPITAL_BASED_OUTPATIENT_CLINIC_OR_DEPARTMENT_OTHER): Payer: Self-pay

## 2023-03-31 ENCOUNTER — Other Ambulatory Visit (HOSPITAL_BASED_OUTPATIENT_CLINIC_OR_DEPARTMENT_OTHER): Payer: Self-pay

## 2023-04-01 ENCOUNTER — Other Ambulatory Visit (HOSPITAL_BASED_OUTPATIENT_CLINIC_OR_DEPARTMENT_OTHER): Payer: Self-pay

## 2023-04-04 ENCOUNTER — Other Ambulatory Visit (HOSPITAL_BASED_OUTPATIENT_CLINIC_OR_DEPARTMENT_OTHER): Payer: Self-pay

## 2023-04-05 ENCOUNTER — Other Ambulatory Visit (HOSPITAL_BASED_OUTPATIENT_CLINIC_OR_DEPARTMENT_OTHER): Payer: Self-pay

## 2023-04-07 ENCOUNTER — Other Ambulatory Visit (HOSPITAL_BASED_OUTPATIENT_CLINIC_OR_DEPARTMENT_OTHER): Payer: Self-pay

## 2023-04-12 ENCOUNTER — Other Ambulatory Visit (HOSPITAL_BASED_OUTPATIENT_CLINIC_OR_DEPARTMENT_OTHER): Payer: Self-pay

## 2023-04-18 ENCOUNTER — Other Ambulatory Visit (HOSPITAL_BASED_OUTPATIENT_CLINIC_OR_DEPARTMENT_OTHER): Payer: Self-pay

## 2023-04-20 ENCOUNTER — Other Ambulatory Visit (HOSPITAL_BASED_OUTPATIENT_CLINIC_OR_DEPARTMENT_OTHER): Payer: Self-pay

## 2023-04-21 ENCOUNTER — Other Ambulatory Visit (HOSPITAL_BASED_OUTPATIENT_CLINIC_OR_DEPARTMENT_OTHER): Payer: Self-pay

## 2023-05-12 ENCOUNTER — Encounter (HOSPITAL_BASED_OUTPATIENT_CLINIC_OR_DEPARTMENT_OTHER): Payer: Self-pay | Admitting: Family

## 2023-05-12 ENCOUNTER — Ambulatory Visit (HOSPITAL_BASED_OUTPATIENT_CLINIC_OR_DEPARTMENT_OTHER): Payer: 59 | Admitting: Family

## 2023-05-12 VITALS — BP 129/87 | HR 99 | Ht 60.0 in | Wt 246.0 lb

## 2023-05-12 DIAGNOSIS — G4733 Obstructive sleep apnea (adult) (pediatric): Secondary | ICD-10-CM

## 2023-05-12 DIAGNOSIS — Z6841 Body Mass Index (BMI) 40.0 and over, adult: Secondary | ICD-10-CM | POA: Diagnosis not present

## 2023-05-12 DIAGNOSIS — I493 Ventricular premature depolarization: Secondary | ICD-10-CM | POA: Diagnosis not present

## 2023-05-12 DIAGNOSIS — I4711 Inappropriate sinus tachycardia, so stated: Secondary | ICD-10-CM

## 2023-05-12 DIAGNOSIS — I10 Essential (primary) hypertension: Secondary | ICD-10-CM

## 2023-05-12 NOTE — Progress Notes (Signed)
[facility] Assessment:    Date:  05/12/2023   ID:  Harlen Labs, DOB 1986/04/27, MRN [id number]  PCP:  Leilani Able, MD  Cardiologist:  Christell Constant, MD  Nephrologist:  Referring MD: Leilani Able, MD   CC: Hypertension  History of Present Illness:    Kinnidi Gratton is a 37 y.o. female with a hx of HTN, PVC, OSA on CPAP, GERD, anxiety, depression.   Established with [facility] 03/15/22. Prior evaluation 06/2021 with Dr. Izora Ribas for murmur and dyspnea. Echo LVEF 60-65%, normal diastolic function. ZIO ordered but not completed.   Diagnosed with hypertension at 37 years old which she attributed to stress. Ongoing palpitations up to 20 minutes since her hysterescopy with tubal ligation 04/2021. Referred to [facility] exercise program, [facility]. Sleep study 08/2022 positive for sleep apnea and CPAP titration ordered.   Seen 11/04/22 BP controlled on home monitoring. Started on Metoprolol succinate 25mg  daily for palpitations.  At visit 01/06/2023 prior authorization submitted for [facility] but not covered by her insurance plan.  Presents today for follow up.  Feeling overall well since last seen.  Does note stress related to her job.  Following low-sodium diet has been vegetarian for 25 years.  Walking for exercise at the gym.  Starting new insurance plan in January which will cover Vascepa.  Previous antihypertensives:   Past Medical History:  Diagnosis Date   Anxiety    Depression    DOE (dyspnea on exertion)    Essential hypertension 03/15/2022   GERD (gastroesophageal reflux disease)    Inappropriate sinus tachycardia (HCC) 11/04/2022   OSA on CPAP 11/04/2022   PVC's (premature ventricular contractions) 03/15/2022   Retained intrauterine contraceptive device (IUD) 04/07/2021   Wears partial dentures    upper    Past Surgical History:  Procedure Laterality Date   HYSTEROSCOPY WITH RESECTOSCOPE N/A  04/17/2021   Procedure: HYSTEROSCOPY;  Surgeon: Maxie Better, MD;  Location: [facility];  Service: Gynecology;  Laterality: N/A;   TUBAL LIGATION Bilateral 04/17/2021   Procedure: BILATERAL TUBAL LIGATION;  Surgeon: Maxie Better, MD;  Location: [facility];  Service: Gynecology;  Laterality: Bilateral;    Current Medications: Current Meds  Medication Sig   amLODipine (NORVASC) 2.5 MG tablet Take 1 tablet (2.5 mg total) by mouth daily.   cholecalciferol (VITAMIN D) 1000 units tablet Take 1,000 Units by mouth daily.   hydrochlorothiazide (HYDRODIURIL) 25 MG tablet Take 1 tablet (25 mg total) by mouth daily.   hydrOXYzine (ATARAX) 25 MG tablet Take 1 tablet (25 mg total) by mouth every 8 (eight) hours as needed for anxiety.   ibuprofen (ADVIL) 800 MG tablet Take 1 tablet (800 mg total) by mouth every 8 (eight) hours as needed. (Patient taking differently: Take 800 mg by mouth every 8 (eight) hours as needed for mild pain (pain score 1-3).)   metoprolol succinate (TOPROL XL) 25 MG 24 hr tablet Take 1 tablet (25 mg total) by mouth daily.   Multiple Vitamins-Minerals (MULTIVITAMIN WITH MINERALS) tablet Take 1 tablet by mouth daily.   pantoprazole (PROTONIX) 40 MG tablet Take 1 tablet (40 mg total) by mouth daily.   Semaglutide-Weight Management (WEGOVY) 0.25 MG/0.5ML SOAJ Inject 0.25 mg (0.5 ml) into the skin once a week from weeks 1 through 4.   Semaglutide-Weight Management (WEGOVY) 0.25 MG/0.5ML SOAJ Inject 0.25 mg into the skin once a week.     Allergies:   Gelatin, Other, and Pork-derived products  Social History   Socioeconomic History   Marital status: Single    Spouse name: Not on file   Number of children: 0   Years of education: 15   Highest education level: Not on file  Occupational History   Occupation: Scientist, product/process development support    Comment: At home for [employer]  Tobacco Use   Smoking status: Never   Smokeless tobacco: Never  Vaping Use    Vaping status: Never Used  Substance and Sexual Activity   Alcohol use: Yes    Comment: occ   Drug use: No   Sexual activity: Not on file  Other Topics Concern   Not on file  Social History Narrative   Lives in [place], married with spouse , has one dog.  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 Grew up in [place] and was raised by mom. Has 2 brother and pt is middle child. 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    Social Determinants of Health   Financial Resource Strain: Low Risk  (03/15/2022)   Overall Financial Resource Strain (CARDIA)    Difficulty of Paying Living Expenses: Not hard at all  Food Insecurity: No Food Insecurity (03/15/2022)   Hunger Vital Sign    Worried About Running Out of Food in the Last Year: Never true    Ran Out of Food in the Last Year: Never true  Transportation Needs: No Transportation Needs (03/15/2022)   PRAPARE - Administrator, Civil Service (Medical): No    Lack of Transportation (Non-Medical): No  Physical Activity: Inactive (03/15/2022)   Exercise Vital Sign    Days of Exercise per Week: 0 days    Minutes of Exercise per Session: 0 min  Stress: Not on file  Social Connections: Not on file     Family History: The patient's family history includes Anxiety disorder in her brother and mother; Bipolar disorder in her mother; COPD in her father; Depression in her brother; Diverticulitis in her mother; Hypertension in her mother; Other in her father; Post-traumatic stress disorder in her brother; Schizophrenia in her maternal grandfather and mother. There is no history of Suicidality, Colon cancer, or Esophageal cancer.  ROS:   Please see the history of present illness.     All other systems reviewed and are negative.  EKGs/Labs/Other Studies Reviewed:         Recent Labs: 12/22/2022: ALT 11; BUN 5; Creatinine, Ser 0.86; Hemoglobin 12.3; Platelets 370; Potassium 3.1; Sodium 133   Recent Lipid  Panel    Component Value Date/Time   CHOL 213 (H) 03/16/2022 1112   TRIG 131 03/16/2022 1112   HDL 41 03/16/2022 1112   CHOLHDL 5.2 (H) 03/16/2022 1112   CHOLHDL 3 02/24/2017 0729   VLDL 18.6 02/24/2017 0729   LDLCALC 148 (H) 03/16/2022 1112    Physical Exam:   VS:  BP 129/87   Pulse 99   Ht 5' (1.524 m)   Wt 246 lb (111.6 kg)   SpO2 99%   BMI 48.04 kg/m  , BMI Body mass index is 48.04 kg/m. GENERAL:  Well appearing HEENT: Pupils equal round and reactive, fundi not visualized, oral mucosa unremarkable NECK:  No jugular venous distention, waveform within normal limits, carotid upstroke brisk and symmetric, no bruits, no thyromegaly LYMPHATICS:  No cervical adenopathy LUNGS:  Clear to auscultation bilaterally HEART:  RRR.  PMI not displaced or sustained,S1 and S2 within normal limits, no S3, no  S4, no clicks, no rubs, no murmurs ABD:  Flat, positive bowel sounds normal in frequency in pitch, no bruits, no rebound, no guarding, no midline pulsatile mass, no hepatomegaly, no splenomegaly EXT:  2 plus pulses throughout, no edema, no cyanosis no clubbing SKIN:  No rashes no nodules NEURO:  Cranial nerves II through XII grossly intact, motor grossly intact throughout PSYCH:  Cognitively intact, oriented to person place and time   ASSESSMENT/PLAN:    HTN - BP well controlled. Continue current antihypertensive regimen.    PVC / Inappropriate sinus tachycardia -palpitations overall quiescent on metoprolol 25mg  daily. Continue same. If palpitations worsen, she will contact us and consider increasing dose.   OSA - CPAP compliance encouraged.  Continues to use regularly.   Anxiety - Continue to follow with PCP.   Obesity - Exercising regularly by walking at the gym and has been limiting portion sizes.  Reginal Lutes not covered by present insurance plan but will be covered after the start of the year new insurance plan.  Will set reminder to complete prior authorization at that  time.  Screening for Secondary Hypertension:     03/15/2022   10:45 AM  Causes  Drugs/Herbals Screened     - Comments No caffeine.  Rare EtOH.  No tobacco.  Ibuprofen 3 days per month.  Sleep Apnea Screened     - Comments check sleep study    Relevant Labs/Studies:    Latest Ref Rng & Units 12/22/2022    8:56 PM 03/16/2022   11:12 AM 06/16/2021   11:05 AM  Basic Labs  Sodium 135 - 145 mmol/L 133  138  140   Potassium 3.5 - 5.1 mmol/L 3.1  3.8  4.6   Creatinine 0.44 - 1.00 mg/dL 1.61  0.96  0.45        Latest Ref Rng & Units 03/16/2022   11:12 AM 05/08/2021    2:54 PM  Thyroid   TSH 0.450 - 4.500 uIU/mL 1.510  1.210                  Disposition:    FU with MD/PharmD in 4 months    Medication Adjustments/Labs and Tests Ordered: Current medicines are reviewed at length with the patient today.  Concerns regarding medicines are outlined above.  No orders of the defined types were placed in this encounter.  No orders of the defined types were placed in this encounter.    Signed, Alver Sorrow, NP  05/12/2023 11:30 AM    [facility]

## 2023-05-12 NOTE — Patient Instructions (Addendum)
Medication Instructions:  Continue your current medications.     Follow-Up: In 4 months with Hypertension Clinic    Special Instructions:   We will send a prior authorization for [facility] in January.  If you will send [place] your insurance information when you get it.

## 2023-06-09 ENCOUNTER — Other Ambulatory Visit (HOSPITAL_COMMUNITY): Payer: Self-pay

## 2023-06-09 ENCOUNTER — Telehealth (HOSPITAL_BASED_OUTPATIENT_CLINIC_OR_DEPARTMENT_OTHER): Payer: Self-pay | Admitting: Pharmacy Technician

## 2023-06-09 NOTE — Telephone Encounter (Signed)
 Pharmacy Patient Advocate Encounter   Received notification from Physician's Office that prior authorization for wegovy  is required/requested.   Insurance verification completed.   The patient is insured through [facility] .   Per test claim: PA required; PA submitted to above mentioned insurance via CoverMyMeds Key/confirmation #/EOC [facility] Status is pending

## 2023-06-09 NOTE — Telephone Encounter (Signed)
 Pharmacy Patient Advocate Encounter  Received notification from [facility] that Prior Authorization for wegovy has been CANCELLED due to   PA #/Case ID/Reference #: [id number]

## 2023-06-09 NOTE — Telephone Encounter (Signed)
-----   Message from Alver Sorrow sent at 06/09/2023  7:48 AM EST ----- Regarding: LKGMWN PA Can we please do PA for [facility] with new insurance coverage? Card available under media tab.  TY! Alver Sorrow, NP

## 2023-06-16 NOTE — Telephone Encounter (Signed)
 Called and left voice message for patient regarding unread MyChart message of prior auth.

## 2023-06-17 NOTE — Telephone Encounter (Signed)
 Left detailed VM per DPR.  Will remove from triage pool. Alver Sorrow, NP

## 2023-09-01 ENCOUNTER — Other Ambulatory Visit: Payer: Self-pay | Admitting: Family Medicine

## 2023-09-01 ENCOUNTER — Ambulatory Visit
Admission: RE | Admit: 2023-09-01 | Discharge: 2023-09-01 | Disposition: A | Discharge status: Home / Self Care | Source: Ambulatory Visit | Attending: Family Medicine | Admitting: Family Medicine

## 2023-09-01 DIAGNOSIS — R059 Cough, unspecified: Secondary | ICD-10-CM

## 2023-09-01 DIAGNOSIS — R0602 Shortness of breath: Secondary | ICD-10-CM

## 2023-10-03 ENCOUNTER — Telehealth: Payer: Self-pay | Admitting: Pharmacy Technician

## 2023-10-03 ENCOUNTER — Telehealth: Payer: Self-pay | Admitting: Internal Medicine

## 2023-10-03 ENCOUNTER — Other Ambulatory Visit (HOSPITAL_COMMUNITY): Payer: Self-pay

## 2023-10-03 NOTE — Telephone Encounter (Signed)
Will forward to pharmacy team.  

## 2023-10-03 NOTE — Telephone Encounter (Signed)
 Pharmacy Patient Advocate Encounter   Received notification from Pt Calls Messages that prior authorization for wegovy  is required/requested.   Insurance verification completed.   The patient is insured through [facility] .   Per test claim: PA required; PA submitted to above mentioned insurance via CoverMyMeds Key/confirmation #/EOC [id number] Status is pending

## 2023-10-03 NOTE — Telephone Encounter (Signed)
 Pt c/o medication issue:  1. Name of Medication: WEGOVY   2. How are you currently taking this medication (dosage and times per day)?   3. Are you having a reaction (difficulty breathing--STAT)? no  4. What is your medication issue? Patient states she has completed her the program for insurance for he medication. Calling to see what next. Please advise

## 2023-10-03 NOTE — Telephone Encounter (Signed)
 Pharmacy Patient Advocate Encounter  Received notification from [employer] that Prior Authorization for wegovy  has been APPROVED from 10/03/23 to 02/02/24. Ran test claim, Copay is $1,284.94. This test claim was processed through [facility]- copay amounts may vary at other pharmacies due to pharmacy/plan contracts, or as the patient moves through the different stages of their insurance plan.   PA #/Case ID/Reference #: [id number]

## 2023-10-03 NOTE — Telephone Encounter (Signed)
 Looks like she completed program required by insurance.  Please try PA for Wegovy  again.

## 2023-10-04 NOTE — Telephone Encounter (Signed)
 Wegovy  "covered" with copay of $1285.  Looks as though plan may have high rx deductible. LMOM for patient to call and discuss.

## 2023-10-28 ENCOUNTER — Other Ambulatory Visit (HOSPITAL_BASED_OUTPATIENT_CLINIC_OR_DEPARTMENT_OTHER): Payer: Self-pay | Admitting: Cardiovascular Disease

## 2023-12-25 ENCOUNTER — Other Ambulatory Visit: Payer: Self-pay | Admitting: Cardiology

## 2023-12-26 ENCOUNTER — Encounter: Payer: Self-pay | Admitting: *Deleted

## 2023-12-27 ENCOUNTER — Ambulatory Visit (HOSPITAL_BASED_OUTPATIENT_CLINIC_OR_DEPARTMENT_OTHER): Admitting: Cardiovascular Disease

## 2023-12-27 ENCOUNTER — Encounter (HOSPITAL_BASED_OUTPATIENT_CLINIC_OR_DEPARTMENT_OTHER): Payer: Self-pay | Admitting: Cardiovascular Disease

## 2023-12-27 VITALS — BP 100/76 | HR 84 | Ht 60.0 in | Wt 235.0 lb

## 2023-12-27 DIAGNOSIS — I4711 Inappropriate sinus tachycardia, so stated: Secondary | ICD-10-CM

## 2023-12-27 DIAGNOSIS — I493 Ventricular premature depolarization: Secondary | ICD-10-CM | POA: Diagnosis not present

## 2023-12-27 DIAGNOSIS — I10 Essential (primary) hypertension: Secondary | ICD-10-CM | POA: Diagnosis not present

## 2023-12-27 DIAGNOSIS — G4733 Obstructive sleep apnea (adult) (pediatric): Secondary | ICD-10-CM

## 2023-12-27 DIAGNOSIS — E876 Hypokalemia: Secondary | ICD-10-CM

## 2023-12-27 LAB — BASIC METABOLIC PANEL WITH GFR
BUN/Creatinine Ratio: 7 — ABNORMAL LOW (ref 9–23)
BUN: 6 mg/dL (ref 6–20)
CO2: 24 mmol/L (ref 20–29)
Calcium: 9.9 mg/dL (ref 8.7–10.2)
Chloride: 98 mmol/L (ref 96–106)
Creatinine, Ser: 0.81 mg/dL (ref 0.57–1.00)
Glucose: 99 mg/dL (ref 70–99)
Potassium: 4.1 mmol/L (ref 3.5–5.2)
Sodium: 140 mmol/L (ref 134–144)
eGFR: 97 mL/min/1.73 (ref >59)

## 2023-12-27 MED ORDER — ZEPBOUND 5 MG/0.5ML ~~LOC~~ SOAJ: 5.0000 mg | SUBCUTANEOUS | 5 refills | Status: DC

## 2023-12-27 MED ORDER — ZEPBOUND 2.5 MG/0.5ML ~~LOC~~ SOAJ: 2.5000 mg | SUBCUTANEOUS | 0 refills | Status: AC

## 2023-12-27 NOTE — Progress Notes (Signed)
 [facility] Follow-up Assessment:    Date:  12/27/2023   ID:  Dana Todd, DOB [birth year], MRN [id number]  PCP:  Ilah Crigler, MD  Cardiologist:  Stanly DELENA Leavens, MD   Referring MD: Ilah Crigler, MD   CC: Hypertension  History of Present Illness:    Dana Todd is a 38 y.o. female with a hx of hypertension, PVCs, OSA on CPAP, inappropriate sinus tachycardia, GERD, anxiety, and depression, here for follow-up. She was initially seen 03/15/2022 in the [facility]. She last saw Dr. Leavens 06/2021 for a murmur and shortness of breath. She had an Echo that revealed 60-65% and normal diastolic function. Blood pressure at that time was 118/72. She reported palpitations and a ZIO monitor was ordered but not completed. She saw Dr. Rutherford 12/2021 and her blood pressure was 138/100 on HCTZ, so she was referred to the [facility].  She was diagnosed with hypertension at 38 years old, which she attributed to significant work stress at the time. 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 Occasional episodes of palpitations lasting up to 20 minutes ever since her hysteroscopy with tubal ligation 04/2021. She reported weight gain from 130 to 237 lbs over a few years. Kidney and liver function were normal. LDL was elevated at 148. She was referred to [facility]. She was referred for a sleep study. Her blood pressure was 112/86 and no changes were made to her medication.  At her appointment 02/2023 she was feeling better and blood pressures are better controlled.  She had a sleep study 08/2022 that was positive for sleep apnea and a CPAP titration was ordered.  At her visit 10/2022 she was doing well and blood pressure was well-controlled.  She continued working on weight loss.  She continue struggling with  tachycardia.  We did note that this was partially due to anxiety.  She was started on metoprolol  due to resting heart rate of 109 in clinic.  Encouraged her to continue working with her primary care provider on her anxiety.  She followed up with Reche Finder, NP.  They attempted to get her on Wegovy  but it was not approved by her insurance.  She discussed job-related stress and blood pressure remains controlled.  Discussed the use of AI scribe software for clinical note transcription with the patient, who gave verbal consent to proceed.  History of Present Illness Ms. [name] has been monitoring her blood pressure monthly, consistently recording values under 120 mmHg. She experiences occasional heart palpitations, which she attributes to dehydration, and has increased her water intake, especially during a recent heat wave, to manage this. No lightheadedness, dizziness, or current swelling is reported. She reports feeling satisfied with her current blood pressure readings.  She was previously prescribed a weight loss medication, but her insurance initially denied it. After completing a six-month program, she was approved for Zepbound , which she is eager to start at a dose of 2.5 mg once a week for four weeks, then increase to 5 mg once a week.  In terms of physical activity, she has not been exercising as much as she should but remains active in her garden and closes her activity rings for about 45 minutes a day. She notes increased difficulty in movement compared to her younger years and gets winded more easily, though she does not experience any  alarming symptoms like heart palpitations during activity.  Her past medical history includes episodes of low potassium, associated with her use of hydrochlorothiazide .    Past Medical History:  Diagnosis Date   Anxiety    Depression    DOE (dyspnea on exertion)    Essential hypertension 03/15/2022   GERD (gastroesophageal reflux disease)     Inappropriate sinus tachycardia (HCC) 11/04/2022   OSA on CPAP 11/04/2022   PVC's (premature ventricular contractions) 03/15/2022   Retained intrauterine contraceptive device (IUD) 04/07/2021   Wears partial dentures    upper    Past Surgical History:  Procedure Laterality Date   HYSTEROSCOPY WITH RESECTOSCOPE N/A 04/17/2021   Procedure: HYSTEROSCOPY;  Surgeon: Rutherford Gain, MD;  Location: [facility];  Service: Gynecology;  Laterality: N/A;   TUBAL LIGATION Bilateral 04/17/2021   Procedure: BILATERAL TUBAL LIGATION;  Surgeon: Rutherford Gain, MD;  Location: [facility];  Service: Gynecology;  Laterality: Bilateral;    Current Medications: Current Meds  Medication Sig   albuterol (VENTOLIN HFA) 108 (90 Base) MCG/ACT inhaler Inhale into the lungs.   amLODipine  (NORVASC ) 2.5 MG tablet Take 1 tablet (2.5 mg total) by mouth daily.   cholecalciferol (VITAMIN D ) 1000 units tablet Take 1,000 Units by mouth daily.   hydrochlorothiazide  (HYDRODIURIL ) 25 MG tablet TAKE 1 TABLET (25 MG TOTAL) BY MOUTH DAILY.   hydrOXYzine  (ATARAX ) 25 MG tablet Take 1 tablet (25 mg total) by mouth every 8 (eight) hours as needed for anxiety.   ibuprofen  (ADVIL ) 800 MG tablet Take 1 tablet (800 mg total) by mouth every 8 (eight) hours as needed.   metFORMIN (GLUCOPHAGE-XR) 500 MG 24 hr tablet Take 1,000 mg by mouth daily.   metoprolol  succinate (TOPROL -XL) 25 MG 24 hr tablet TAKE 1 TABLET (25 MG TOTAL) BY MOUTH DAILY.   Multiple Vitamins-Minerals (MULTIVITAMIN WITH MINERALS) tablet Take 1 tablet by mouth daily.   tirzepatide  (ZEPBOUND ) 2.5 MG/0.5ML Pen Inject 2.5 mg into the skin once a week for 28 days. FOR 4 WEEKS ONLY THEN INCREASE TO 5 MG WEEKLY   [START ON 01/24/2024] tirzepatide  (ZEPBOUND ) 5 MG/0.5ML Pen Inject 5 mg into the skin once a week. WHEN YOU FINISH THE 2.5 MG     Allergies:   Gelatin, Other, and Pork-derived products   Social History   Socioeconomic  History   Marital status: Single    Spouse name: Not on file   Number of children: 0   Years of education: 15   Highest education level: Not on file  Occupational History   Occupation: Scientist, product/process development support    Comment: At home for [employer]  Tobacco Use   Smoking status: Never   Smokeless tobacco: Never  Vaping Use   Vaping status: Never Used  Substance and Sexual Activity   Alcohol use: Yes    Comment: occ   Drug use: No   Sexual activity: Not on file  Other Topics Concern   Not on file  Social History Narrative   Lives in [place], married with spouse , has one dog.  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 Grew up in [place] and was raised by mom. Has 2 brother and pt is middle child. 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    Social Drivers of Corporate investment banker Strain: Low Risk  (03/15/2022)   Overall Financial Resource Strain (CARDIA)    Difficulty of Paying Living Expenses: Not hard at all  Food Insecurity: No Food Insecurity (03/15/2022)   Hunger Vital Sign    Worried About Running Out of Food in the Last Year: Never true    Ran Out of Food in the Last Year: Never true  Transportation Needs: No Transportation Needs (03/15/2022)   PRAPARE - Administrator, Civil Service (Medical): No    Lack of Transportation (Non-Medical): No  Physical Activity: Inactive (03/15/2022)   Exercise Vital Sign    Days of Exercise per Week: 0 days    Minutes of Exercise per Session: 0 min  Stress: Not on file  Social Connections: Not on file     Family History: The patient's family history includes Anxiety disorder in her brother and mother; Bipolar disorder in her mother; COPD in her father; Depression in her brother; Diverticulitis in her mother; Hypertension in her mother; Other in her father; Post-traumatic stress disorder in her brother; Schizophrenia in her maternal grandfather and mother. There is no history of Suicidality, Colon  cancer, or Esophageal cancer.  ROS:   Please see the history of present illness.    (+) Palpitations (+) Stress (+) Anxiety All other systems reviewed and are negative.  EKGs/Labs/Other Studies Reviewed:    Echocardiogram  06/25/2021:  1. Left ventricular ejection fraction, by estimation, is 60 to 65%. The  left ventricle has normal function. The left ventricle has no regional  wall motion abnormalities. Left ventricular diastolic parameters were  normal.   2. Right ventricular systolic function is normal. The right ventricular  size is normal.   3. The mitral valve is normal in structure. No evidence of mitral valve  regurgitation. No evidence of mitral stenosis.   4. The aortic valve is normal in structure. Aortic valve regurgitation is  not visualized. No aortic stenosis is present.   5. The inferior vena cava is normal in size with greater than 50%  respiratory variability, suggesting right atrial pressure of 3 mmHg.   CTA Chest  05/08/2021: IMPRESSION: 1. No segmental or larger pulmonary embolus. 2. No acute thoracic abnormality.  EKG:  EKG is personally reviewed. 11/04/2022:  EKG was not ordered. 07/05/2022:  EKG was not ordered. 03/15/2022: Sinus rhythm. Rate 97 bpm. PVCs.    Recent Labs: No results found for requested labs within last 365 days.   Recent Lipid Panel    Component Value Date/Time   CHOL 213 (H) 03/16/2022 1112   TRIG 131 03/16/2022 1112   HDL 41 03/16/2022 1112   CHOLHDL 5.2 (H) 03/16/2022 1112   CHOLHDL 3 02/24/2017 0729   VLDL 18.6 02/24/2017 0729   LDLCALC 148 (H) 03/16/2022 1112    Physical Exam:    VS:  BP 100/76   Pulse 84   Ht 5' (1.524 m)   Wt 235 lb (106.6 kg)   SpO2 99%   BMI 45.90 kg/m  , BMI Body mass index is 45.9 kg/m. GENERAL:  Well appearing HEENT: Pupils equal round and reactive, fundi not visualized, oral mucosa unremarkable NECK:  No jugular venous distention, waveform within normal limits, carotid upstroke brisk and  symmetric, no bruits, no thyromegaly LUNGS:  Clear to auscultation bilaterally HEART:  RRR.  PMI not displaced or sustained,S1 and S2 within normal limits, no S3, no S4, no clicks, no rubs, no murmurs ABD:  Flat, positive bowel sounds normal in frequency in pitch, no bruits, no rebound, no guarding, no midline pulsatile mass, no hepatomegaly, no splenomegaly EXT:  2 plus pulses throughout, no edema, no cyanosis no clubbing  SKIN:  No rashes no nodules NEURO:  Cranial nerves II through XII grossly intact, motor grossly intact throughout PSYCH:  Cognitively intact, oriented to person place and time   ASSESSMENT/PLAN:    Assessment & Plan # Heart palpitations # PVCs # Hypokalemia Intermittent palpitations possibly related to dehydration. EKG showed PVCs, potentially linked to low potassium levels. - Check  BMP - Advise increased hydration.  # Hypertension Blood pressure well-controlled under 120 mmHg. Anticipated weight loss with Zepbound  may necessitate antihypertensive medication adjustment. - Monitor blood pressure regularly. - Advise to report any lightheadedness or dizziness. - Consider reducing antihypertensive medication if blood pressure decreases further with weight loss. - Continue   # OSA: Continue CPAP.  # Morbid Obesity Initiated Zepbound .   Completed six-month program required by insurance. Advised on potential side effects of Zepbound , including nausea with large meals, and importance of smaller meals. - Prescribe Zepbound  starting at 2.5 mg once a week for four weeks, then increase to 5 mg once a week. - Advise on consuming smaller meals to mitigate nausea. - Monitor weight loss progress and adjust plan as needed.   Screening for Secondary Hypertension:     03/15/2022   10:45 AM  Causes  Drugs/Herbals Screened     - Comments No caffeine.  Rare EtOH.  No tobacco.  Ibuprofen  3 days per month.  Sleep Apnea Screened     - Comments check sleep study    Relevant  Labs/Studies:    Latest Ref Rng & Units 12/22/2022    8:56 PM 03/16/2022   11:12 AM 06/16/2021   11:05 AM  Basic Labs  Sodium 135 - 145 mmol/L 133  138  140   Potassium 3.5 - 5.1 mmol/L 3.1  3.8  4.6   Creatinine 0.44 - 1.00 mg/dL 9.13  9.22  9.27        Latest Ref Rng & Units 03/16/2022   11:12 AM 05/08/2021    2:54 PM  Thyroid    TSH 0.450 - 4.500 uIU/mL 1.510  1.210      Disposition:    FU with APP or me in 3 months.   Medication Adjustments/Labs and Tests Ordered: Current medicines are reviewed at length with the patient today.  Concerns regarding medicines are outlined above.   Orders Placed This Encounter  Procedures   Basic metabolic panel with GFR   EKG [id number]   Meds ordered this encounter  Medications   tirzepatide  (ZEPBOUND ) 2.5 MG/0.5ML Pen    Sig: Inject 2.5 mg into the skin once a week for 28 days. FOR 4 WEEKS ONLY THEN INCREASE TO 5 MG WEEKLY    Dispense:  2 mL    Refill:  0   tirzepatide  (ZEPBOUND ) 5 MG/0.5ML Pen    Sig: Inject 5 mg into the skin once a week. WHEN YOU FINISH THE 2.5 MG    Dispense:  2 mL    Refill:  5    Signed, Annabella Scarce, MD  12/27/2023 12:32 PM    [facility]

## 2023-12-27 NOTE — Patient Instructions (Signed)
 Medication Instructions:  START ZEPBOUND  2.5 MG WEEKLY FOR 4 WEEKS THEN INCREASE TO 5 MG WEEKLY   Labwork: BMET TODAY   Testing/Procedures: NONE  Follow-Up: 3 MONTHS WITH CAITLIN W NP, DR Dimmitt, OR MICHELLE S NP   Any Other Special Instructions Will Be Listed Below (If Applicable).  Subcutaneous Injection Instructions Using a Prefilled Syringe A subcutaneous injection is a shot of medicine that is given into the layer of fat and tissue between skin and muscle. The injection is given with a single-use syringe that is already filled with medicine. The syringe is called a prefilled syringe. Read the medicine guide or package insert that came with the syringe. Follow directions from the guide about how to prepare and give the injection. This is important because the directions may be different for each medicine. Use only the syringe, needle, and medicine that your health care provider prescribes. Use each prefilled syringe and needle only one time. Supplies needed: Prefilled syringe with needle. Use the needle length and size (gauge) that your provider or pharmacist gives to you. Alcohol wipes. Gauze. Bandage. A container to put used syringes. This may be a sharps container or a hard plastic container that has a secure lid, such as an empty laundry detergent bottle. How to choose a site for injection Follow instructions from your provider about where to give an injection. Do not inject in the same spot each time. There are five main areas that can be used for injecting. These areas include: Abdomen. Avoid the area that is within 2 inches (5 cm) of your navel (umbilicus). Front of thigh. Upper, outer side of thigh. Upper, outer side of arm. Upper, outer part of butt. How to give an injection using a prefilled syringe  Wash your hands with soap and water. If soap and water are not available, use hand sanitizer. Use an alcohol wipe to clean the site where you will be injecting the  needle. Let the site air-dry. Remove the plastic cover from the needle on the syringe. Do not let the needle touch anything. Hold the syringe with the needle pointing up. Check the syringe for any remaining air bubbles. If there are air bubbles, flick the syringe with your finger until the air bubbles rise to the top. Then, gently push on the plunger until you can see a drop of medicine appear at the tip of the needle. This will clear any remaining air bubbles from the syringe. Hold the syringe in your writing hand like a pencil. Use your other hand to pinch and hold about an inch (2.5 cm) of skin. Do not directly touch the cleaned part of the skin. Gently but quickly, put the needle straight into the skin. The needle should be at a 90-degree angle to the skin. The needle may need to be injected at a 45-degree angle in thin adults or children who have a small amount of body fat. Follow instructions from your provider about the right size needle and angle you should use for the injection. After the needle is completely inserted into the skin, release the skin that you are pinching. Continue to hold the syringe with your writing hand. Use your thumb or index finger of your writing hand to push the plunger all the way into the syringe to inject the medicine. Pull the needle straight out of the skin. If there is bleeding: Press and hold a piece of gauze over the injection site until bleeding stops. Do not rub the area. Cover the  injection site with a bandage, if needed. How to safely throw away the supplies If you are using a syringe that does not have a safety system for shielding the needle after injection: Do not recap the needle. Place the syringe and needle in the disposal container. If your syringe has a safety system for shielding the needle after injection: Firmly push down on the plunger after you complete the injection. The protective sleeve will automatically cover the needle, and you will  hear a click. The click means that the needle is safely covered. Follow the disposal regulations for the area where you live. Do not use any syringe or needle more than one time. Contact a health care provider if: You have trouble giving the injection. You think that the injection was not given correctly. You have trouble with any of the supplies. The medicine causes side effects. You get a rash on your skin. A get a fever. The condition that is being treated gets worse. Get help right away if: You get any of these symptoms after the injection is given: Trouble breathing. Chest pain. A rash over most or all of your body. Swelling of the lips or tongue. Trouble swallowing. These symptoms may be an emergency. Get help right away. Call 911. Do not wait to see if the symptoms will go away. Do not drive yourself to the hospital. This information is not intended to replace advice given to you by your health care provider. Make sure you discuss any questions you have with your health care provider. Document Revised: 03/11/2022 Document Reviewed: 03/11/2022 Elsevier Patient Education  2024 [employer].

## 2024-01-01 ENCOUNTER — Ambulatory Visit: Payer: Self-pay | Admitting: Cardiovascular Disease

## 2024-03-22 ENCOUNTER — Encounter (HOSPITAL_BASED_OUTPATIENT_CLINIC_OR_DEPARTMENT_OTHER): Payer: Self-pay | Admitting: Family

## 2024-03-22 ENCOUNTER — Ambulatory Visit (HOSPITAL_BASED_OUTPATIENT_CLINIC_OR_DEPARTMENT_OTHER): Admitting: Family

## 2024-03-22 VITALS — BP 110/80 | HR 99 | Ht 60.0 in | Wt 230.0 lb

## 2024-03-22 DIAGNOSIS — I493 Ventricular premature depolarization: Secondary | ICD-10-CM

## 2024-03-22 DIAGNOSIS — I10 Essential (primary) hypertension: Secondary | ICD-10-CM | POA: Diagnosis not present

## 2024-03-22 DIAGNOSIS — R002 Palpitations: Secondary | ICD-10-CM

## 2024-03-22 DIAGNOSIS — G4733 Obstructive sleep apnea (adult) (pediatric): Secondary | ICD-10-CM

## 2024-03-22 NOTE — Progress Notes (Signed)
 [facility] Assessment:    Date:  03/22/2024   ID:  Dana Todd, DOB 05-Apr-1986, MRN [id number]  PCP:  Ilah Crigler, MD  Cardiologist:  Stanly DELENA Leavens, MD  Nephrologist:  Referring MD: Ilah Crigler, MD   CC: Hypertension  History of Present Illness:    Dana Todd is a 38 y.o. female with a hx of HTN, PVC, OSA on CPAP, GERD, anxiety, depression.   Established with [facility] 03/15/22. Prior evaluation 06/2021 with Dr. Leavens for murmur and dyspnea. Echo LVEF 60-65%, normal diastolic function. ZIO ordered but not completed.   Diagnosed with hypertension at 38 years old which she attributed to stress. Ongoing palpitations up to 20 minutes since her hysterescopy with tubal ligation 04/2021. Referred to PREP exercise program, [facility]. Sleep study 08/2022 positive for sleep apnea and CPAP titration ordered.   Seen 11/04/22 BP controlled on home monitoring. Started on Metoprolol  succinate 25mg  daily for palpitations.  At visit 01/06/2023 prior authorization submitted for Wegovy  but not covered by her insurance plan.  At visit 05/2023 BP was controlled and palpitations quiescent on metoprolol  25 mg daily.  She was last seen 12/2023 by Dr. Raford.  She was advised increase hydration with intermittent palpitations and EKG with PVC.  BP was at goal.  Zepbound  initiated as she had completed the 54-month program required by her insurance prior to Zepbound .  Presents today for follow up.  Feeling overall well since last seen. Following low-sodium diet has been vegetarian for 25 years.  Walking for exercise.  Tolerating Zepbound  2.5 mg weekly.  Has to have prescribed through a weight loss clinic through [facility] per her insurance.  She has had mild nausea with 2.5 mg dose and discussed tactics such as small frequent meals, ginger.  Reports BP routinely less than 130/80.  Her goal is to lose weight and be off blood pressure  medications.  Her palpitations have been quiescent.  Previous antihypertensives:   Past Medical History:  Diagnosis Date   Anxiety    Depression    DOE (dyspnea on exertion)    Essential hypertension 03/15/2022   GERD (gastroesophageal reflux disease)    Inappropriate sinus tachycardia 11/04/2022   OSA on CPAP 11/04/2022   PVC's (premature ventricular contractions) 03/15/2022   Retained intrauterine contraceptive device (IUD) 04/07/2021   Wears partial dentures    upper    Past Surgical History:  Procedure Laterality Date   HYSTEROSCOPY WITH RESECTOSCOPE N/A 04/17/2021   Procedure: HYSTEROSCOPY;  Surgeon: Rutherford Gain, MD;  Location: [facility];  Service: Gynecology;  Laterality: N/A;   TUBAL LIGATION Bilateral 04/17/2021   Procedure: BILATERAL TUBAL LIGATION;  Surgeon: Rutherford Gain, MD;  Location: [facility];  Service: Gynecology;  Laterality: Bilateral;    Current Medications: Current Meds  Medication Sig   albuterol (VENTOLIN HFA) 108 (90 Base) MCG/ACT inhaler Inhale into the lungs.   amLODipine  (NORVASC ) 2.5 MG tablet TAKE 1 TABLET BY MOUTH EVERY DAY   cholecalciferol (VITAMIN D ) 1000 units tablet Take 1,000 Units by mouth daily.   hydrochlorothiazide  (HYDRODIURIL ) 25 MG tablet TAKE 1 TABLET (25 MG TOTAL) BY MOUTH DAILY.   hydrOXYzine  (ATARAX ) 25 MG tablet Take 1 tablet (25 mg total) by mouth every 8 (eight) hours as needed for anxiety.   ibuprofen  (ADVIL ) 800 MG tablet Take 1 tablet (800 mg total) by mouth every 8 (eight) hours as needed.   metoprolol  succinate (TOPROL -XL) 25 MG 24 hr tablet TAKE  1 TABLET (25 MG TOTAL) BY MOUTH DAILY.   Multiple Vitamins-Minerals (MULTIVITAMIN WITH MINERALS) tablet Take 1 tablet by mouth daily.   REXULTI 1 MG TABS tablet Take 1 mg by mouth at bedtime.   sertraline (ZOLOFT) 50 MG tablet Take 50 mg by mouth at bedtime.   traZODone (DESYREL) 50 MG tablet Take 50 mg by mouth at bedtime as needed.    ZEPBOUND  2.5 MG/0.5ML Pen Inject 2.5 mg into the skin once a week.   [DISCONTINUED] tirzepatide  (ZEPBOUND ) 5 MG/0.5ML Pen Inject 5 mg into the skin once a week. WHEN YOU FINISH THE 2.5 MG     Allergies:   Gelatin, Other, and Porcine (pork) protein-containing drug products   Social History   Socioeconomic History   Marital status: Single    Spouse name: Not on file   Number of children: 0   Years of education: 15   Highest education level: Not on file  Occupational History   Occupation: Scientist, product/process development support    Comment: At home for [employer]  Tobacco Use   Smoking status: Never   Smokeless tobacco: Never  Vaping Use   Vaping status: Never Used  Substance and Sexual Activity   Alcohol use: Yes    Comment: occ   Drug use: No   Sexual activity: Not on file  Other Topics Concern   Not on file  Social History Narrative   Lives in [place], married with spouse , has one dog.  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 Grew up in [place] and was raised by mom. Has 2 brother and pt is middle child. 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    Social Drivers of Corporate investment banker Strain: Low Risk  (03/15/2022)   Overall Financial Resource Strain (CARDIA)    Difficulty of Paying Living Expenses: Not hard at all  Food Insecurity: No Food Insecurity (03/15/2022)   Hunger Vital Sign    Worried About Running Out of Food in the Last Year: Never true    Ran Out of Food in the Last Year: Never true  Transportation Needs: No Transportation Needs (03/15/2022)   PRAPARE - Administrator, Civil Service (Medical): No    Lack of Transportation (Non-Medical): No  Physical Activity: Inactive (03/15/2022)   Exercise Vital Sign    Days of Exercise per Week: 0 days    Minutes of Exercise per Session: 0 min  Stress: Not on file  Social Connections: Not on file     Family History: The patient's family history includes Anxiety disorder in her  brother and mother; Bipolar disorder in her mother; COPD in her father; Depression in her brother; Diverticulitis in her mother; Hypertension in her mother; Other in her father; Post-traumatic stress disorder in her brother; Schizophrenia in her maternal grandfather and mother. There is no history of Suicidality, Colon cancer, or Esophageal cancer.  ROS:   Please see the history of present illness.     All other systems reviewed and are negative.  EKGs/Labs/Other Studies Reviewed:         Recent Labs: 12/27/2023: BUN 6; Creatinine, Ser 0.81; Potassium 4.1; Sodium 140   Recent Lipid Panel    Component Value Date/Time   CHOL 213 (H) 03/16/2022 1112   TRIG 131 03/16/2022 1112   HDL 41 03/16/2022 1112   CHOLHDL 5.2 (H) 03/16/2022 1112   CHOLHDL 3 02/24/2017 0729   VLDL 18.6 02/24/2017 0729  LDLCALC 148 (H) 03/16/2022 1112    Physical Exam:   VS:  BP 110/80   Pulse 99   Ht 5' (1.524 m)   Wt 230 lb (104.3 kg)   SpO2 99%   BMI 44.92 kg/m  , BMI Body mass index is 44.92 kg/m. GENERAL:  Well appearing HEENT: Pupils equal round and reactive, fundi not visualized, oral mucosa unremarkable NECK:  No jugular venous distention, waveform within normal limits, carotid upstroke brisk and symmetric, no bruits, no thyromegaly LYMPHATICS:  No cervical adenopathy LUNGS:  Clear to auscultation bilaterally HEART:  RRR.  PMI not displaced or sustained,S1 and S2 within normal limits, no S3, no S4, no clicks, no rubs, no murmurs ABD:  Flat, positive bowel sounds normal in frequency in pitch, no bruits, no rebound, no guarding, no midline pulsatile mass, no hepatomegaly, no splenomegaly EXT:  2 plus pulses throughout, no edema, no cyanosis no clubbing SKIN:  No rashes no nodules NEURO:  Cranial nerves II through XII grossly intact, motor grossly intact throughout PSYCH:  Cognitively intact, oriented to person place and time  ASSESSMENT/PLAN:    HTN - BP well controlled. Continue current  antihypertensive regimen amlodipine  2.5 mg daily, hydrochlorothiazide  25 mg daily, Toprol  25 mg daily.  Discussed if BP routinely less than 120 at home or if she develops lightheadedness or dizziness would plan to discontinue amlodipine  first reduce dose of HCTZ.  PVC / Inappropriate sinus tachycardia -palpitations overall quiescent on metoprolol  25mg  daily. Continue same.   OSA - CPAP compliance encouraged.  Continues to use regularly.   Anxiety - Continue to follow with PCP.   Obesity -completed 6 months program required by her insurance prior to GLP-1.  Now on Zepbound . Exercising regularly by walking at the gym and has been limiting portion sizes.   Screening for Secondary Hypertension:     03/15/2022   10:45 AM  Causes  Drugs/Herbals Screened     - Comments No caffeine.  Rare EtOH.  No tobacco.  Ibuprofen  3 days per month.  Sleep Apnea Screened     - Comments check sleep study    Relevant Labs/Studies:    Latest Ref Rng & Units 12/27/2023   11:12 AM 12/22/2022    8:56 PM 03/16/2022   11:12 AM  Basic Labs  Sodium 134 - 144 mmol/L 140  133  138   Potassium 3.5 - 5.2 mmol/L 4.1  3.1  3.8   Creatinine 0.57 - 1.00 mg/dL 9.18  9.13  9.22        Latest Ref Rng & Units 03/16/2022   11:12 AM 05/08/2021    2:54 PM  Thyroid    TSH 0.450 - 4.500 uIU/mL 1.510  1.210                  Disposition:    FU with MD/PharmD in 6 months    Medication Adjustments/Labs and Tests Ordered: Current medicines are reviewed at length with the patient today.  Concerns regarding medicines are outlined above.  No orders of the defined types were placed in this encounter.  No orders of the defined types were placed in this encounter.    Signed, Reche GORMAN Finder, NP  03/22/2024 10:41 AM    [facility]

## 2024-03-22 NOTE — Patient Instructions (Addendum)
 Medication Instructions:  Continue your current medications    Follow-Up: Please follow up in 6 months in [facility] with Dr. Raford, Reche Finder, NP or Allean Mink PharmD    Special Instructions:    If your blood pressure is consistently less than 120/80 or if you are having lightheadedness or dizziness consistently let us  know and we can consider reducing your blood pressure regimen.

## 2024-03-27 ENCOUNTER — Telehealth: Payer: Self-pay | Admitting: Pharmacy Technician

## 2024-03-27 NOTE — Telephone Encounter (Signed)
   Pharmacy Patient Advocate Encounter   Received notification from Onbase that prior authorization for zepbound  is required/requested.   Insurance verification completed.   The patient is insured through [facility].   Per pa:    Sent pt a message

## 2024-05-04 ENCOUNTER — Emergency Department (HOSPITAL_COMMUNITY)

## 2024-05-04 ENCOUNTER — Emergency Department (HOSPITAL_COMMUNITY)
Admission: EM | Admit: 2024-05-04 | Discharge: 2024-05-04 | Disposition: A | Discharge status: Home / Self Care | Attending: Emergency Medicine | Admitting: Emergency Medicine

## 2024-05-04 ENCOUNTER — Other Ambulatory Visit: Payer: Self-pay

## 2024-05-04 DIAGNOSIS — Z79899 Other long term (current) drug therapy: Secondary | ICD-10-CM | POA: Insufficient documentation

## 2024-05-04 DIAGNOSIS — I10 Essential (primary) hypertension: Secondary | ICD-10-CM | POA: Insufficient documentation

## 2024-05-04 DIAGNOSIS — R079 Chest pain, unspecified: Secondary | ICD-10-CM | POA: Diagnosis present

## 2024-05-04 LAB — HCG, SERUM, QUALITATIVE: Preg, Serum: NEGATIVE

## 2024-05-04 LAB — BASIC METABOLIC PANEL WITH GFR
Anion gap: 15 (ref 5–15)
BUN: 5 mg/dL — ABNORMAL LOW (ref 6–20)
CO2: 22 mmol/L (ref 22–32)
Calcium: 8.7 mg/dL — ABNORMAL LOW (ref 8.9–10.3)
Chloride: 100 mmol/L (ref 98–111)
Creatinine, Ser: 0.77 mg/dL (ref 0.44–1.00)
GFR, Estimated: >60 mL/min (ref >60)
Glucose, Bld: 128 mg/dL — ABNORMAL HIGH (ref 70–99)
Potassium: 2.9 mmol/L — ABNORMAL LOW (ref 3.5–5.1)
Sodium: 137 mmol/L (ref 135–145)

## 2024-05-04 LAB — CBC
HCT: 38.3 % (ref 36.0–46.0)
Hemoglobin: 12.5 g/dL (ref 12.0–15.0)
MCH: 28.9 pg (ref 26.0–34.0)
MCHC: 32.6 g/dL (ref 30.0–36.0)
MCV: 88.5 fL (ref 80.0–100.0)
Platelets: 329 K/uL (ref 150–400)
RBC: 4.33 MIL/uL (ref 3.87–5.11)
RDW: 14.3 % (ref 11.5–15.5)
WBC: 10.5 K/uL (ref 4.0–10.5)
nRBC: 0 % (ref 0.0–0.2)

## 2024-05-04 LAB — MAGNESIUM: Magnesium: 2 mg/dL (ref 1.7–2.4)

## 2024-05-04 LAB — RESP PANEL BY RT-PCR (RSV, FLU A&B, COVID)  RVPGX2
Influenza A by PCR: NEGATIVE
Influenza B by PCR: NEGATIVE
Resp Syncytial Virus by PCR: NEGATIVE
SARS Coronavirus 2 by RT PCR: NEGATIVE

## 2024-05-04 LAB — BRAIN NATRIURETIC PEPTIDE: B Natriuretic Peptide: 40.2 pg/mL (ref 0.0–100.0)

## 2024-05-04 LAB — TROPONIN I (HIGH SENSITIVITY)
Troponin I (High Sensitivity): 4 ng/L (ref <18)
Troponin I (High Sensitivity): 3 ng/L (ref <18)

## 2024-05-04 MED ORDER — MAGNESIUM 30 MG PO TABS: 30.0000 mg | ORAL_TABLET | Freq: Two times a day (BID) | ORAL | 0 refills | Status: AC

## 2024-05-04 MED ORDER — POTASSIUM CHLORIDE CRYS ER 20 MEQ PO TBCR
40.0000 meq | EXTENDED_RELEASE_TABLET | Freq: Once | ORAL | Status: AC
  Administered 2024-05-04: 40 meq via ORAL
  Filled 2024-05-04: qty 2

## 2024-05-04 MED ORDER — MAGNESIUM OXIDE -MG SUPPLEMENT 400 (240 MG) MG PO TABS
400.0000 mg | ORAL_TABLET | Freq: Once | ORAL | Status: AC
  Administered 2024-05-04: 400 mg via ORAL
  Filled 2024-05-04: qty 1

## 2024-05-04 MED ORDER — POTASSIUM CHLORIDE CRYS ER 20 MEQ PO TBCR: 40.0000 meq | EXTENDED_RELEASE_TABLET | Freq: Two times a day (BID) | ORAL | 2 refills | Status: AC

## 2024-05-04 NOTE — ED Triage Notes (Signed)
 Patient denies ever having chest pain. States has L axillary pain. SOB that feels different than her hx of asthma. SOB worse when lying down but constant no matter activity level. Compliant with all medications.

## 2024-05-04 NOTE — ED Triage Notes (Signed)
 Patient  states that she thinks she is having a heart attack. She has had chest pain, nausea, shob and, feels like her heart is beating fast and hard, also had had left arm pain for the past 2 days.

## 2024-05-04 NOTE — ED Provider Notes (Signed)
 [place] EMERGENCY DEPARTMENT AT [facility] Provider Note   CSN: [id number] Arrival date & time: 05/04/24  1408     Patient presents with: Nausea, Chills (/), and Arm Pain   Dana Todd is a 38 y.o. female. Hx of HTN, PVC, OSA on CPAP, GERD, anxiety, depression presenting with chest pain, shortness of breath, nausea, and palpitations.  Also endorsing left arm pain.  History per patient.  Endorses symptoms for the past 2 days.  States that primarily she has been having left arm pain associated with numbness, and may be having symptoms radiate to her chest but states that she has been overall feeling pretty well.  Today she started having some nausea and vomiting, and she was feeling more concerned about the symptoms.  She states that she googled her symptoms, and was told that she may be having a heart attack, therefore presented to the ED.  Endorses that her symptoms have completely resolved at this time, and she feels much better.  She endorses that she has been compliant with her medications at home, including metoprolol , amlodipine , hydrochlorothiazide , atarax .   {Add pertinent medical, surgical, social history, OB history to HPI:32947}  Arm Pain       Prior to Admission medications   Medication Sig Start Date End Date Taking? Authorizing Provider  albuterol (VENTOLIN HFA) 108 (90 Base) MCG/ACT inhaler Inhale into the lungs. 12/26/23   [provider]  amLODipine  (NORVASC ) 2.5 MG tablet TAKE 1 TABLET BY MOUTH EVERY DAY 12/27/23   Shlomo Wilbert SAUNDERS, MD  cholecalciferol (VITAMIN D ) 1000 units tablet Take 1,000 Units by mouth daily.    [provider]  hydrochlorothiazide  (HYDRODIURIL ) 25 MG tablet TAKE 1 TABLET (25 MG TOTAL) BY MOUTH DAILY. 10/28/23   Raford Riggs, MD  hydrOXYzine  (ATARAX ) 25 MG tablet Take 1 tablet (25 mg total) by mouth every 8 (eight) hours as needed for anxiety. 05/08/21   Hildegard, Amjad, PA-C  ibuprofen  (ADVIL ) 800 MG tablet  Take 1 tablet (800 mg total) by mouth every 8 (eight) hours as needed. 04/17/21   Rutherford Gain, MD  metoprolol  succinate (TOPROL -XL) 25 MG 24 hr tablet TAKE 1 TABLET (25 MG TOTAL) BY MOUTH DAILY. 10/28/23   Raford Riggs, MD  Multiple Vitamins-Minerals (MULTIVITAMIN WITH MINERALS) tablet Take 1 tablet by mouth daily.    [provider]  REXULTI 1 MG TABS tablet Take 1 mg by mouth at bedtime. 02/20/24   [provider]  sertraline (ZOLOFT) 50 MG tablet Take 50 mg by mouth at bedtime. 02/02/24   [provider]  traZODone (DESYREL) 50 MG tablet Take 50 mg by mouth at bedtime as needed. 09/28/23   [provider]  ZEPBOUND  2.5 MG/0.5ML Pen Inject 2.5 mg into the skin once a week. 02/28/24   [provider]    Allergies: Gelatin, Other, and Porcine (pork) protein-containing drug products    Review of Systems  Updated Vital Signs BP 110/72 (BP Location: Right Arm)   Pulse 79   Temp 98.2 F (36.8 C)   Resp 16   SpO2 100%   Physical Exam Vitals and nursing note reviewed.  Constitutional:      General: She is not in acute distress.    Appearance: She is well-developed.  HENT:     Head: Normocephalic and atraumatic.     Mouth/Throat:     Mouth: Mucous membranes are moist.     Pharynx: Oropharynx is clear.  Eyes:     Conjunctiva/sclera: Conjunctivae  normal.  Cardiovascular:     Rate and Rhythm: Normal rate and regular rhythm.     Heart sounds: No murmur heard. Pulmonary:     Effort: Pulmonary effort is normal. No respiratory distress.     Breath sounds: Normal breath sounds.  Abdominal:     Palpations: Abdomen is soft.     Tenderness: There is no abdominal tenderness.  Musculoskeletal:        General: No swelling.     Cervical back: Neck supple.  Skin:    General: Skin is warm and dry.     Capillary Refill: Capillary refill takes less than 2 seconds.  Neurological:     Mental Status: She is alert and oriented to person, place,  and time.  Psychiatric:        Mood and Affect: Mood normal.     (all labs ordered are listed, but only abnormal results are displayed) Labs Reviewed  BASIC METABOLIC PANEL WITH GFR - Abnormal; Notable for the following components:      Result Value   Potassium 2.9 (*)    Glucose, Bld 128 (*)    BUN 5 (*)    Calcium 8.7 (*)    All other components within normal limits  RESP PANEL BY RT-PCR (RSV, FLU A&B, COVID)  RVPGX2  CBC  HCG, SERUM, QUALITATIVE  BRAIN NATRIURETIC PEPTIDE  TROPONIN I (HIGH SENSITIVITY)  TROPONIN I (HIGH SENSITIVITY)    EKG: None  Radiology: DG Chest 2 View Result Date: 05/04/2024 CLINICAL DATA:  Shortness of breath chest EXAM: CHEST - 2 VIEW COMPARISON:  CT dated 09/01/2023 FINDINGS: Low lung volumes with bronchovascular crowding. No focal consolidations. No pleural effusion or pneumothorax. The heart size and mediastinal contours are within normal limits. No acute osseous abnormality. IMPRESSION: Low lung volumes with bronchovascular crowding. No focal consolidations. Electronically Signed   By: Limin  Xu M.D.   On: 05/04/2024 15:11    {Document cardiac monitor, telemetry assessment procedure when appropriate:32947} Procedures   Medications Ordered in the ED - No data to display    {Click here for ABCD2, HEART and other calculators REFRESH Note before signing:1}                              Medical Decision Making Amount and/or Complexity of Data Reviewed Labs: ordered. Radiology: ordered.  Risk OTC drugs. Prescription drug management.   ***  {Document critical care time when appropriate  Document review of labs and clinical decision tools ie CHADS2VASC2, etc  Document your independent review of radiology images and any outside records  Document your discussion with family members, caretakers and with consultants  Document social determinants of health affecting pt's care  Document your decision making why or why not admission, treatments  were needed:32947:::1}   Final diagnoses:  None    ED Discharge Orders     None

## 2024-05-11 ENCOUNTER — Ambulatory Visit: Admitting: Podiatry

## 2024-09-06 ENCOUNTER — Encounter (HOSPITAL_BASED_OUTPATIENT_CLINIC_OR_DEPARTMENT_OTHER): Admitting: Family
